# Patient Record
Sex: Female | Born: 1945 | Race: White | Hispanic: No | Marital: Married | Smoking: Never smoker
Health system: Southern US, Community
[De-identification: ages and names within clinical notes are randomized; demographics above are authoritative.]

## PROBLEM LIST (undated history)

## (undated) DIAGNOSIS — IMO0002 Reserved for concepts with insufficient information to code with codable children: Secondary | ICD-10-CM

## (undated) DIAGNOSIS — G249 Dystonia, unspecified: Secondary | ICD-10-CM

## (undated) DIAGNOSIS — R51 Headache: Secondary | ICD-10-CM

## (undated) DIAGNOSIS — IMO0001 Reserved for inherently not codable concepts without codable children: Secondary | ICD-10-CM

## (undated) DIAGNOSIS — C801 Malignant (primary) neoplasm, unspecified: Secondary | ICD-10-CM

## (undated) DIAGNOSIS — G20 Parkinson's disease: Secondary | ICD-10-CM

## (undated) DIAGNOSIS — J449 Chronic obstructive pulmonary disease, unspecified: Secondary | ICD-10-CM

## (undated) DIAGNOSIS — I951 Orthostatic hypotension: Secondary | ICD-10-CM

## (undated) DIAGNOSIS — K219 Gastro-esophageal reflux disease without esophagitis: Secondary | ICD-10-CM

## (undated) DIAGNOSIS — G20A1 Parkinson's disease without dyskinesia, without mention of fluctuations: Secondary | ICD-10-CM

## (undated) HISTORY — DX: Dystonia, unspecified: G24.9

## (undated) HISTORY — DX: Reserved for inherently not codable concepts without codable children: IMO0001

## (undated) HISTORY — DX: Gastro-esophageal reflux disease without esophagitis: K21.9

## (undated) HISTORY — DX: Parkinson's disease without dyskinesia, without mention of fluctuations: G20.A1

## (undated) HISTORY — DX: Malignant (primary) neoplasm, unspecified: C80.1

## (undated) HISTORY — DX: Reserved for concepts with insufficient information to code with codable children: IMO0002

## (undated) HISTORY — DX: Orthostatic hypotension: I95.1

## (undated) HISTORY — DX: Headache: R51

## (undated) HISTORY — DX: Chronic obstructive pulmonary disease, unspecified: J44.9

## (undated) HISTORY — DX: Parkinson's disease: G20

## (undated) HISTORY — PX: PARTIAL HIP ARTHROPLASTY: SHX733

---

## 2003-05-19 ENCOUNTER — Encounter: Payer: Self-pay | Admitting: Neurology

## 2003-05-19 ENCOUNTER — Ambulatory Visit (HOSPITAL_COMMUNITY): Admission: RE | Admit: 2003-05-19 | Discharge: 2003-05-19 | Payer: Self-pay | Admitting: Neurology

## 2003-05-22 ENCOUNTER — Ambulatory Visit (HOSPITAL_COMMUNITY): Admission: RE | Admit: 2003-05-22 | Discharge: 2003-05-22 | Payer: Self-pay | Admitting: Neurology

## 2004-07-25 ENCOUNTER — Ambulatory Visit (HOSPITAL_COMMUNITY): Admission: RE | Admit: 2004-07-25 | Discharge: 2004-07-25 | Payer: Self-pay | Admitting: Neurology

## 2004-08-04 ENCOUNTER — Encounter: Admission: RE | Admit: 2004-08-04 | Discharge: 2004-08-04 | Payer: Self-pay | Admitting: Neurology

## 2004-08-19 ENCOUNTER — Encounter: Admission: RE | Admit: 2004-08-19 | Discharge: 2004-08-19 | Payer: Self-pay | Admitting: Neurology

## 2007-03-15 ENCOUNTER — Encounter: Admission: RE | Admit: 2007-03-15 | Discharge: 2007-03-15 | Payer: Self-pay | Admitting: Neurology

## 2007-12-25 ENCOUNTER — Encounter: Payer: Self-pay | Admitting: Internal Medicine

## 2008-01-15 ENCOUNTER — Ambulatory Visit: Payer: Self-pay | Admitting: Internal Medicine

## 2008-01-15 DIAGNOSIS — G20 Parkinson's disease: Secondary | ICD-10-CM

## 2008-01-15 DIAGNOSIS — R0609 Other forms of dyspnea: Secondary | ICD-10-CM

## 2008-01-15 DIAGNOSIS — R0989 Other specified symptoms and signs involving the circulatory and respiratory systems: Secondary | ICD-10-CM

## 2008-03-06 ENCOUNTER — Ambulatory Visit: Payer: Self-pay | Admitting: Internal Medicine

## 2012-11-20 ENCOUNTER — Other Ambulatory Visit: Payer: Self-pay | Admitting: Neurology

## 2012-11-20 DIAGNOSIS — R51 Headache: Secondary | ICD-10-CM

## 2012-11-22 ENCOUNTER — Ambulatory Visit
Admission: RE | Admit: 2012-11-22 | Discharge: 2012-11-22 | Disposition: A | Discharge status: Home / Self Care | Payer: Medicare Other | Source: Ambulatory Visit | Attending: Neurology | Admitting: Neurology

## 2012-11-22 DIAGNOSIS — R51 Headache: Secondary | ICD-10-CM

## 2013-03-21 ENCOUNTER — Ambulatory Visit (INDEPENDENT_AMBULATORY_CARE_PROVIDER_SITE_OTHER): Payer: Medicare Other | Admitting: Neurology

## 2013-03-21 ENCOUNTER — Encounter: Payer: Self-pay | Admitting: Neurology

## 2013-03-21 VITALS — BP 88/59 | HR 79 | Temp 97.9°F | Ht 65.0 in | Wt 143.0 lb

## 2013-03-21 DIAGNOSIS — G20 Parkinson's disease: Secondary | ICD-10-CM

## 2013-03-21 DIAGNOSIS — R443 Hallucinations, unspecified: Secondary | ICD-10-CM

## 2013-03-21 DIAGNOSIS — I951 Orthostatic hypotension: Secondary | ICD-10-CM

## 2013-03-21 DIAGNOSIS — R279 Unspecified lack of coordination: Secondary | ICD-10-CM

## 2013-03-21 MED ORDER — AMANTADINE HCL 100 MG PO TABS: 50.0000 mg | ORAL_TABLET | Freq: Two times a day (BID) | ORAL | Status: DC

## 2013-03-21 NOTE — Patient Instructions (Addendum)
  I do have some generic suggestions for you today:    Please drink more water and try to exercise your legs.   Please keep a regular sleep-wake schedule, keep regular meal times, do not skip any meals, eat  healthy snacks in between meals, such as fruit or nuts. Try to eat protein with every meal.   Engage in social activities in your community and with your family and try to keep up with current events by reading the newspaper or watching the news.  I do not think we need to make any changes in your medications at this point. I would like to see you back in 3 months, sooner if we need to. Please call us if you have any interim questions, concerns, or problems or updates to need to discuss.  Brett Canales is my clinical assistant and will answer any of your questions and relay your messages to me and will give you my messages.   Our phone number is [phone]. We also have an after hours call service for urgent matters and there is a physician on-call for urgent questions. For any emergencies you know to call 911 or go to the nearest emergency room.

## 2013-03-21 NOTE — Progress Notes (Signed)
Subjective:    Patient ID: Jenny West is a 67 y.o. female.  HPI  Interim history:  Jenny West is a very pleasant 67 year old right-handed woman who presents for followup consultation of her Parkinson's disease. She is accompanied by her husband today. This is her first visit with me in she previously followed with Dr. Fayrene Fearing love and was last seen by him on 12/20/2012, at which time he felt that she was overall doing fairly well but still had significant postural hypotension. She had improved dyskinesias and no significant change in her MMSE on amantadine. Her falls assessment tool score was 14 at the time. Dr. Sandria Manly talked to her about trying a compression hose and increased her midodrine to 5 mg 3 times a day and continued her on Florinef 0.1 mg daily. He requested a BMP at the time, which was unremarkable. In February after a phone conversation Dr. Sandria Manly increased her amantadine to 50 mg twice daily from once daily due to increase in dyskinesias.  She has an underlying medical history of COPD, recurrent falls with fracture, lower back pain with lumbar radiculopathy, cancer. Her Parkinson's disease is complicated by gait disturbance, orthostatic hypotension, hallucinations and motor complications. Her current medications are: Amantadine 50 mg twice daily, Florinef 0.1 mg once daily, Zyrtec once daily, Sinemet 25-250 mg strength half a tablet 7 times a day, at 7:30 AM, 9:30 AM, 11:30 AM, 1:30 PM, 3:30 PM, 5 PM and 7 PM. Midodrine 5 mg 3 times a day, omeprazole 20 mg 2 by mouth daily, calcium, multivitamin.  I reviewed Dr. Imagene Gurney prior notes and the patient's records and below is a summary of that review:  67 year old right-handed woman who was diagnosed with Parkinson's disease in 1991 at which time she had difficulty using her left hand. She was placed on Eldepryl and Sinemet. She was first seen by Dr. Sandria Manly in May 1994. She had side effects from medication at the time and had side effects on  Permax before then. In December 1995 Parlodel was added. She was on this for several years but developed shortness of breath in 2004 and was tapered off of that medication. She was diagnosed with COPD and in 2008 developed postural hypotension and was started on midodrine after which Florinef was added. She has had motor fluctuations and freezing, postural hypotension and dyskinesias as well as late afternoon hallucinations. She has fallen repeatedly. She does need help with ADLs. She used a walker until December 2013. She fell in September and December 2013 and fractured her left knee. She does not have any impulse control disorder. She has urinary incontinence.She has had double vision treated with prisms. In December 2013 her MMSE was 28, clock drawing was 4, and normal fluency was 8. In January 2014 her MMSE was 27, clock drawing was 4/4, and animal fluency was 13. CT C-spine in January showed no fracture and mild to moderate degenerative changes.  She has an aide come in daily M-F from 8-2 and helps her with the meds and dressing. She has not taken her florinef this morning. She stays in the recliner most of the time and has a 2 wheeled walker which she uses at a house. She fell in the kitchen onto a wooden chair, did not hit the ground and not hit her head. She was in PT in December and January after a fall and tries to do their recommended exercises. She does not drink enough water. She does not sleep well.   Her Past Medical  History Is Significant For: Past Medical History  Diagnosis Date  . Parkinson's disease   . Dyskinesia   . Orthostatic hypotension   . Reflux   . Headache   . Cancer   . COPD (chronic obstructive pulmonary disease)   . Knee fracture     left    Her Past Surgical History Is Significant For: Past Surgical History  Procedure Laterality Date  . Partial hip arthroplasty Left     Her Family History Is Significant For: Family History  Problem Relation Age of Onset  .  Pneumonia Mother   . Heart disease Father   . Cancer Brother   . Cancer Paternal Uncle     Her Social History Is Significant For: History   Social History  . Marital Status: Married    Spouse Name: N/A    Number of Children: N/A  . Years of Education: N/A   Social History Main Topics  . Smoking status: Never Smoker   . Smokeless tobacco: None  . Alcohol Use: No  . Drug Use: No  . Sexually Active: None   Other Topics Concern  . None   Social History Narrative  . None    Her Allergies Are:  Allergies  Allergen Reactions  . Codeine   . Iodine   . Latex   :   Her Current Medications Are:  Outpatient Encounter Prescriptions as of 03/21/2013  Medication Sig Dispense Refill  . Amantadine HCl 100 MG tablet       . aspirin 81 MG tablet Take 81 mg by mouth daily.      . calcium-vitamin D 250-100 MG-UNIT per tablet Take 1 tablet by mouth daily.      . carbidopa-levodopa (SINEMET IR) 25-250 MG per tablet       . cetirizine (ZYRTEC) 10 MG tablet Take 10 mg by mouth daily.      . fish oil-omega-3 fatty acids 1000 MG capsule Take 2 g by mouth daily.      . fludrocortisone (FLORINEF) 0.1 MG tablet       . midodrine (PROAMATINE) 5 MG tablet       . Multiple Vitamins-Minerals (MULTIVITAMIN PO) Take 1 tablet by mouth daily.      Marland Kitchen omeprazole (PRILOSEC) 20 MG capsule Take 20 mg by mouth daily.      . simvastatin (ZOCOR) 20 MG tablet       . tobramycin-dexamethasone (TOBRADEX) ophthalmic solution        No facility-administered encounter medications on file as of 03/21/2013.  :  Review of Systems  Constitutional: Positive for appetite change and unexpected weight change (gain).  HENT: Positive for rhinorrhea and trouble swallowing.   Eyes: Positive for visual disturbance (double vision).  Musculoskeletal: Positive for arthralgias.  Allergic/Immunologic: Positive for environmental allergies.  Neurological: Positive for dizziness, tremors and syncope.       Memory loss   Hematological: Bruises/bleeds easily.       Anemia  Psychiatric/Behavioral: Positive for hallucinations, confusion and sleep disturbance.       Insomnia, sleepiness    Objective:  Neurologic Exam  Physical Exam Physical Examination:   Filed Vitals:   03/21/13 1049  BP: 88/59  Pulse: 79  Temp: 97.9 F (36.6 C)    General Examination: The patient is a very pleasant 67 y.o. female in no acute distress.  HEENT: Normocephalic, atraumatic, pupils are equal, round and reactive to light and accommodation. Funduscopic exam is normal with sharp disc margins noted. Extraocular tracking shows  mild saccadic breakdown without nystagmus noted. There is limitation to upper gaze. There is moderate decrease in eye blink rate. Hearing is intact. Tympanic membranes are clear bilaterally. Face is symmetric with moderate facial masking and normal facial sensation. There is no lip, neck or jaw tremor. Neck is moderately rigid with intact passive ROM. There are no carotid bruits on auscultation. Oropharynx exam reveals mild mouth dryness. No significant airway crowding is noted. Mallampati is class II. Tongue protrudes centrally and palate elevates symmetrically.   There is mild drooling.   Chest: is clear to auscultation without wheezing, rhonchi or crackles noted.  Heart: sounds are regular and normal without murmurs, rubs or gallops noted.   Abdomen: is soft, non-tender and non-distended with normal bowel sounds appreciated on auscultation.  Extremities: There is no pitting edema in the distal lower extremities bilaterally. Pedal pulses are intact.  Skin: is warm and dry with no trophic changes noted.  Musculoskeletal: exam reveals no obvious joint deformities, tenderness or joint swelling or erythema.  Neurologically:  Mental status: The patient is awake and alert, paying good  attention. She is able to partially provide the history. Her husband provides details. She is oriented to: person,  place, time/date, situation, day of week, month of year and year. Her memory, attention, language and knowledge are impaired mildly. There is no aphasia, agnosia, apraxia or anomia. There is a mild degree of bradyphrenia. Speech is moderately hypophonic with mild dysarthria noted. Mood is congruent and affect is normal.   Cranial nerves are as described above under HEENT exam. In addition, shoulder shrug is normal with equal shoulder height noted.  Motor exam: Normal bulk, and strength for age is noted. There are mild dyskinesias noted in her LEs intermittently. Tone is moderately rigid with absence of cogwheeling in the limbs. There is overall severe bradykinesia. There is no drift or rebound. There is no tremor. Romberg was not tested. Reflexes are 1+ in the upper extremities and 1+ in the lower extremities. Toes are downgoing bilaterally. Fine motor skills exam reveals: Finger taps are severely impaired on the right and severely impaired on the left. Hand movements are moderately impaired on the right and moderately impaired on the left. RAP (rapid alternating patting) is moderately impaired on the right and moderately impaired on the left. Foot taps are moderately impaired on the right and moderately impaired on the left. Foot agility (in the form of heel stomping) is moderately impaired on the right and moderately impaired on the left.    Cerebellar testing shows no dysmetria or intention tremor on finger to nose testing. Heel to shin is unremarkable bilaterally. There is no truncal or gait ataxia.   Sensory exam is intact to light touch, pinprick, vibration, temperature sense and proprioception in the upper and lower extremities.   Gait, station and balance exan: She was not asked to stand as she did not bring a walker and is at fall risk. She situated in her wheelchair in leads to the left and her neck is tilted to the left.    Assessment and Plan:   Assessment and Plan:  In summary, Jenny West is a very pleasant 67 y.o.-year old female with a history of advanced Parkinson's disease, left-sided predominant, complicated by orthostatic hypotension, hallucinations, dyskinesias, and mild memory loss. She has remained quite stable in the past 3 months when I compare my exam with Dr. Imagene Gurney last note.  I had a long chat with the patient and her husband about my  findings and the diagnosis of advanced PD, the prognosis and treatment options. We talked about medical treatments and non-pharmacological approaches. We talked about trying to maintain a healthy lifestyle in general. I encouraged the patient to eat healthy, exercise within her limitations and to keep a scheduled bedtime and wake time routine, to not skip any meals and eat healthy snacks in between meals and to have protein with every meal. I asked her to drink more water. I think it is critical that she uses leg exercises and increase her water intake. I did not make any changes to her medications but asked her to be regular with her medication schedule. She did not take her Florinef this morning yet. They needed refills on the amantadine which I provided. I would like to see her back in 3 months from now, sooner if the need arises and encouraged them to call with any interim questions, concerns, problems or updates and refill requests.

## 2013-06-04 ENCOUNTER — Other Ambulatory Visit: Payer: Self-pay

## 2013-06-04 DIAGNOSIS — G20 Parkinson's disease: Secondary | ICD-10-CM

## 2013-06-04 MED ORDER — CARBIDOPA-LEVODOPA 25-250 MG PO TABS: ORAL_TABLET | ORAL | Status: DC

## 2013-06-04 NOTE — Telephone Encounter (Signed)
Spouse called requesting refills on Sinemet.

## 2013-08-04 IMAGING — CT CT HEAD W/O CM
3 series · 18 of 30 positions shown, 20 images · non-contrast
Comparison: none

[Series 3: head bone · axial · 0.49mm/px · z∈[+40,+134]mm · 5 of 32 slices shown]
[im 5/32  bone]
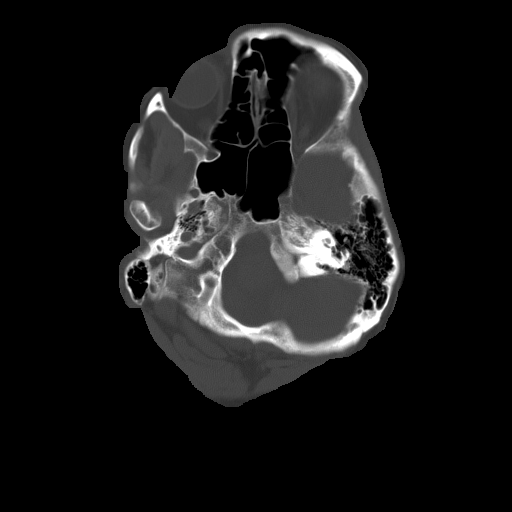
[im 9/32  bone]
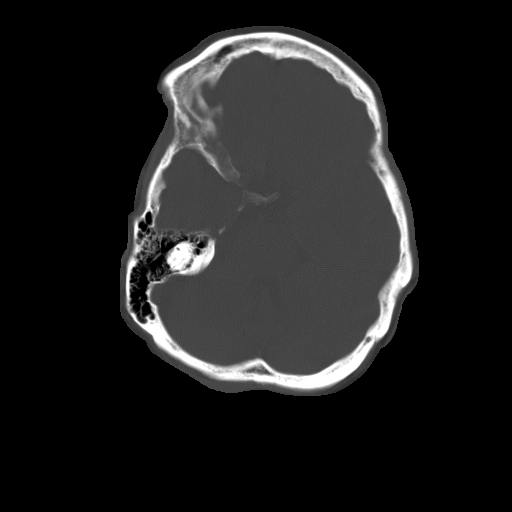
[im 14/32  bone]
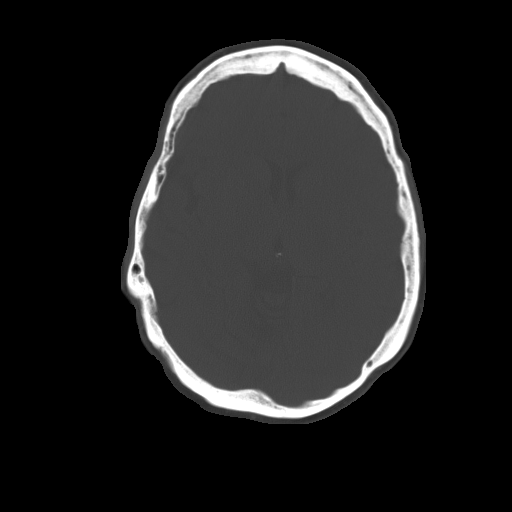
[im 18/32  bone]
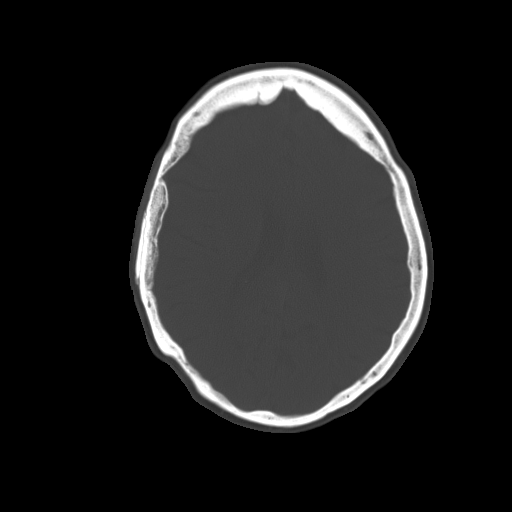
[im 23/32  bone]
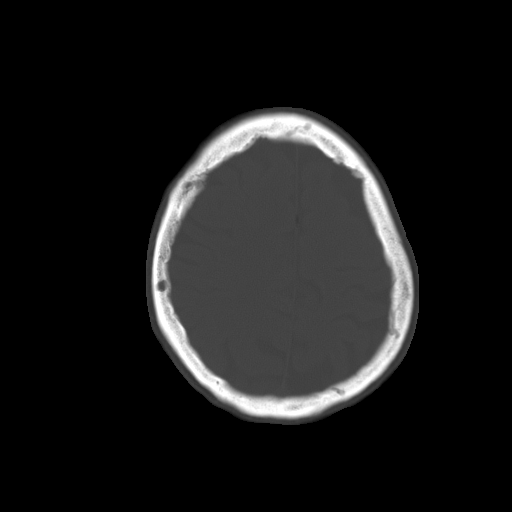

[Series 32: 3d filtered head w/o · axial · non-contrast · 0.49mm/px · z∈[+45,+150]mm · 5 of 32 slices shown, 7 images]
[im 6/32  brain]
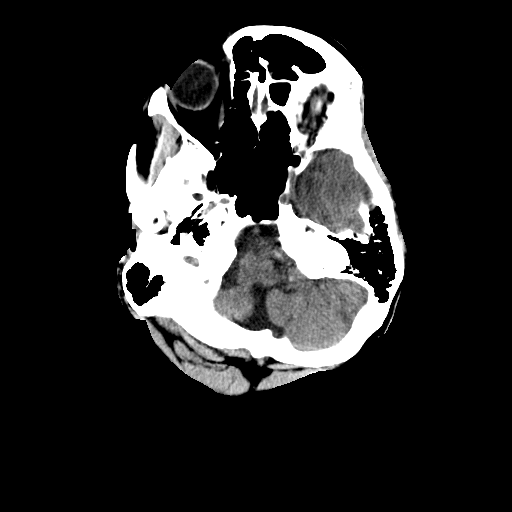
[im 6/32  bone]
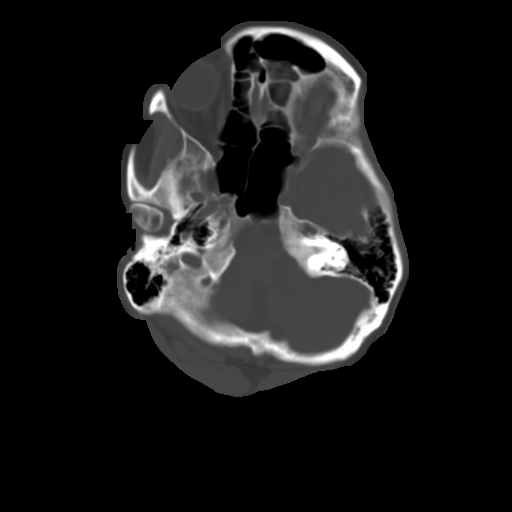
[im 11/32  brain]
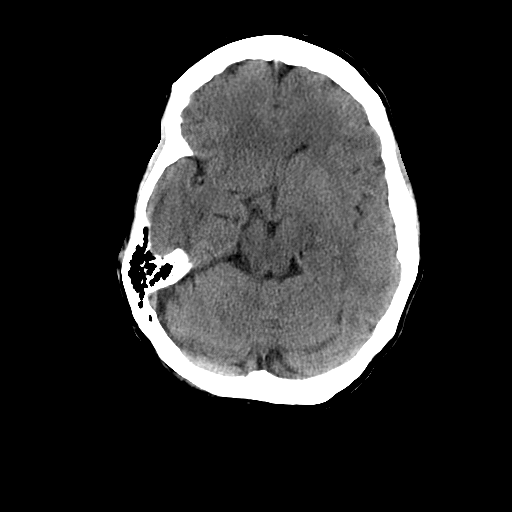
[im 16/32  brain]
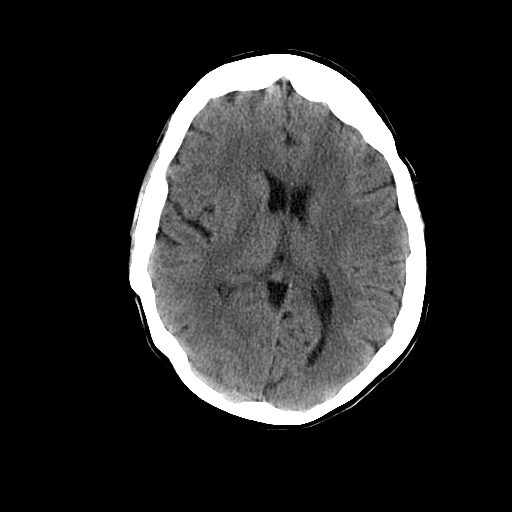
[im 21/32  brain]
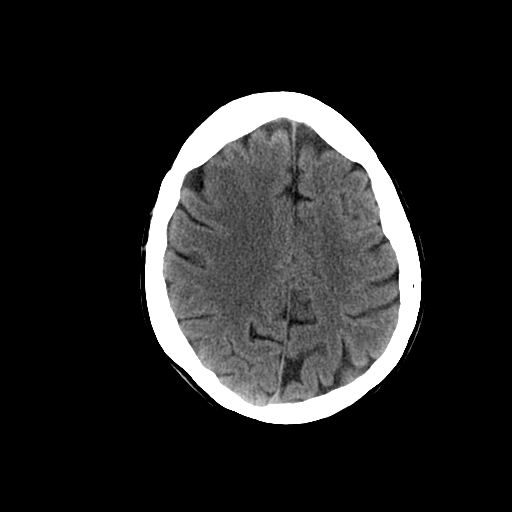
[im 26/32  brain]
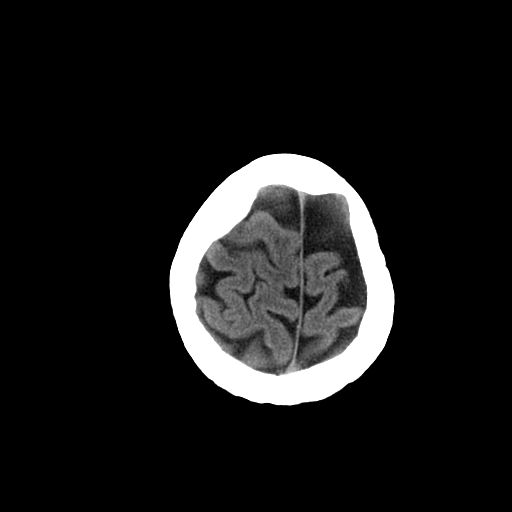
[im 26/32  bone]
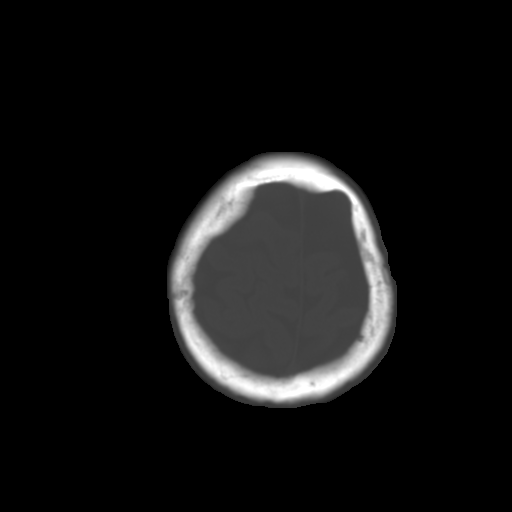

[Series 103: reformatted axial · axial · 0.49mm/px · z∈[+106,+214]mm · 8 of 65 slices shown]
[im 5/65  brain]
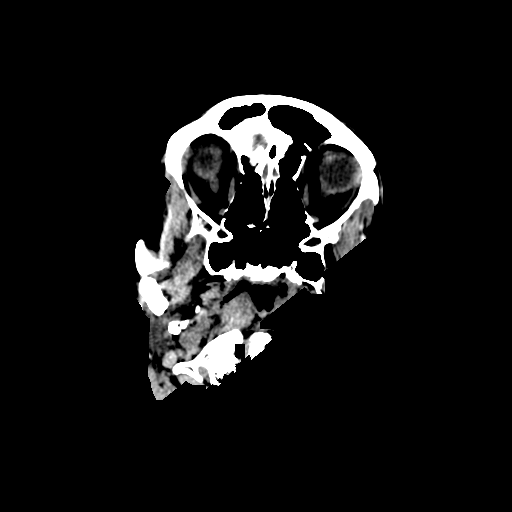
[im 14/65  brain]
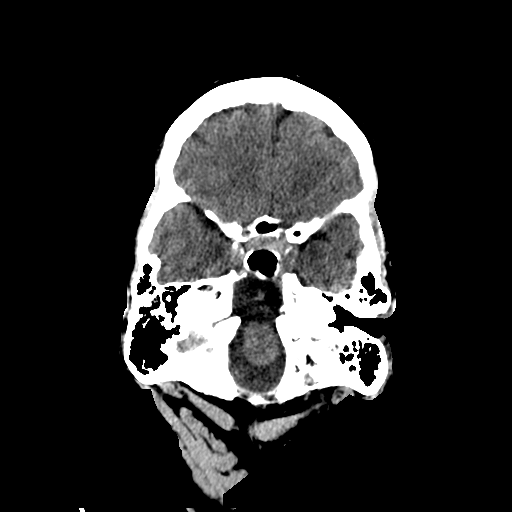
[im 23/65  brain]
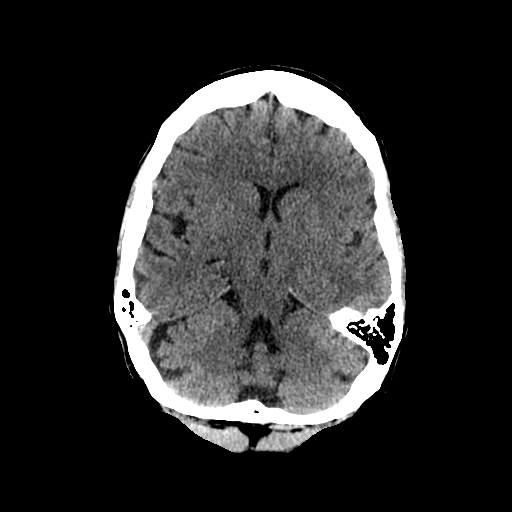
[im 28/65  brain]
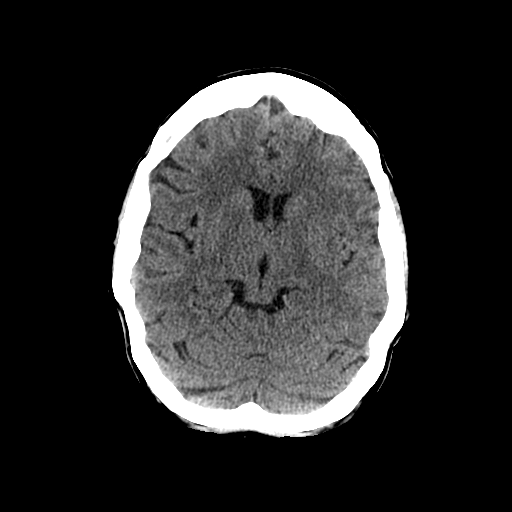
[im 37/65  brain]
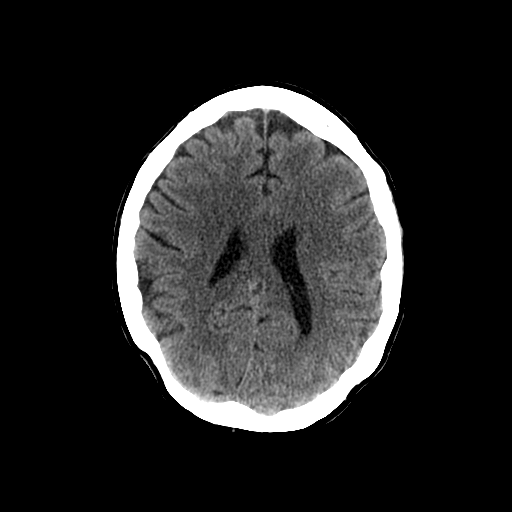
[im 42/65  brain]
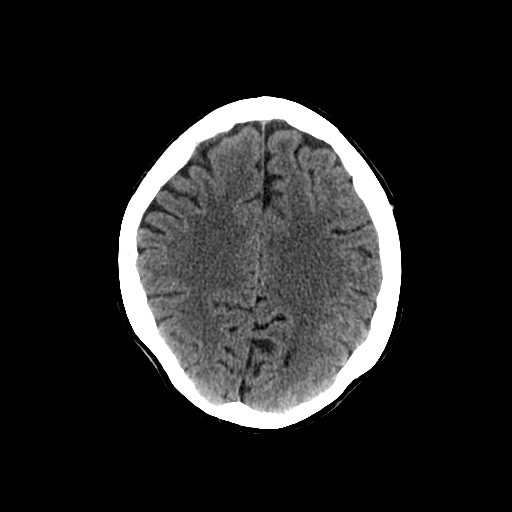
[im 51/65  brain]
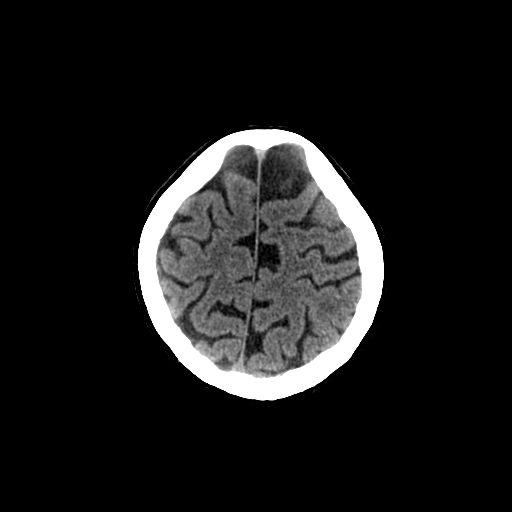
[im 60/65  brain]
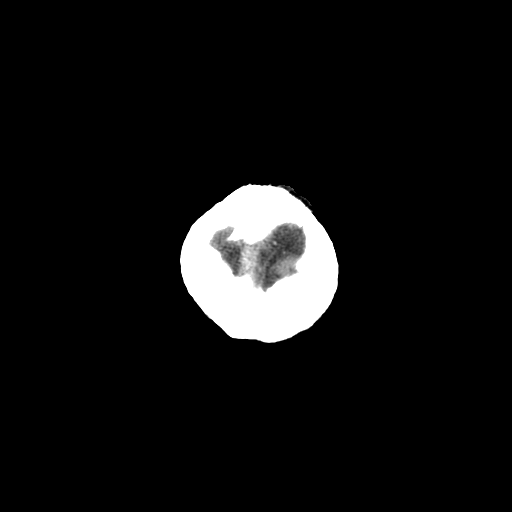

[18 of 30 positions shown; findings below may reference images not displayed]

This examination was performed at [HOSPITAL] at [HOSPITAL]
[HOSPITAL]. The interpretation will be provided by [REDACTED].

## 2013-08-04 IMAGING — CT CT CERVICAL SPINE W/O CM
2 of 3 series · 7 of 14 positions shown, 8 images · non-contrast
Comparison: None.

CLINICAL DATA: Posterior neck pain x1 year

CT CERVICAL SPINE WITHOUT CONTRAST
TECHNIQUE: Multidetector CT imaging of the cervical spine was
performed. Multiplanar CT image reconstructions were also
generated.

[Series 3: c spine bone · axial · 0.31mm/px · z∈[+81,+173]mm · 3 of 75 slices shown, 4 images]
[im 19/75  soft-tissue]
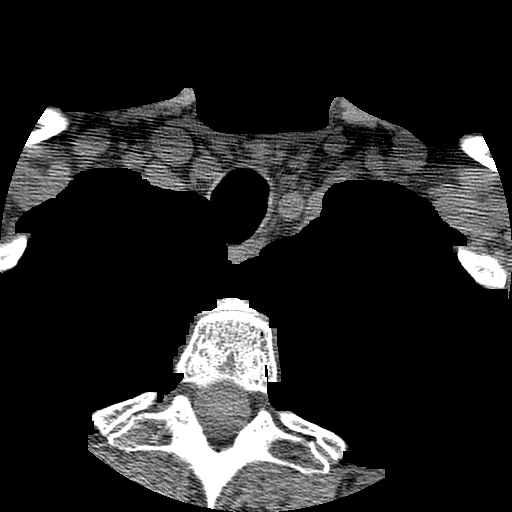
[im 19/75  bone]
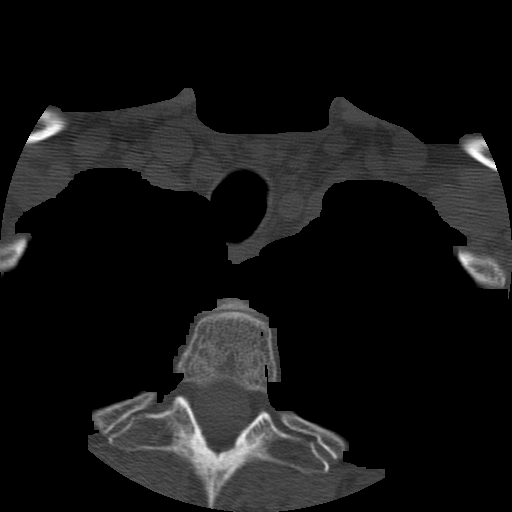
[im 38/75  bone]
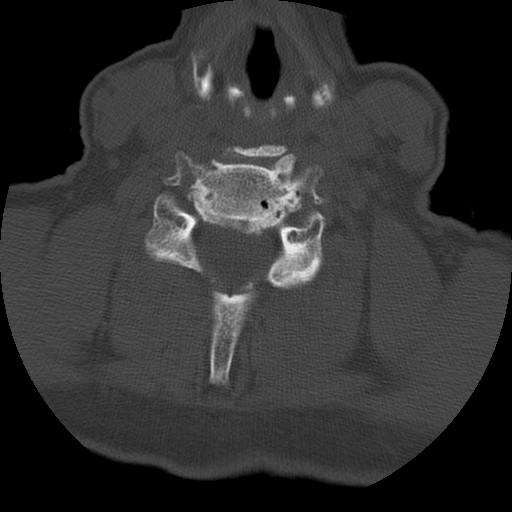
[im 56/75  bone]
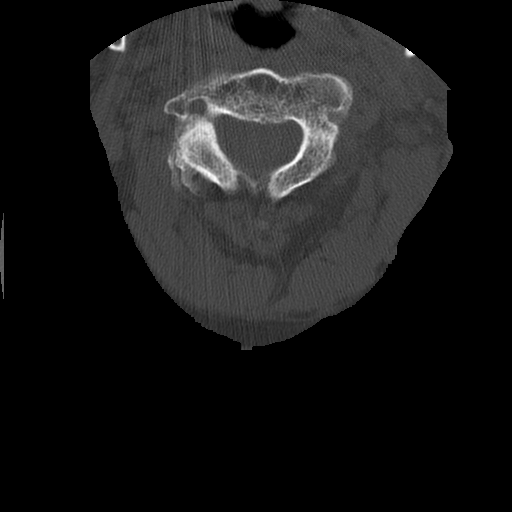

[Series 4: c spine soft · axial · 0.31mm/px · z∈[+71,+183]mm · 4 of 75 slices shown]
[im 15/75  soft-tissue]
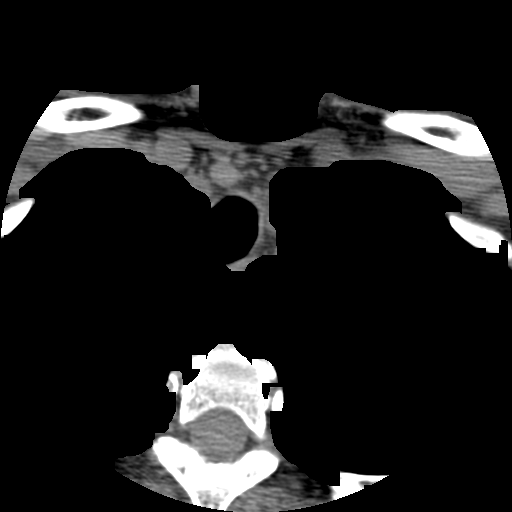
[im 30/75  soft-tissue]
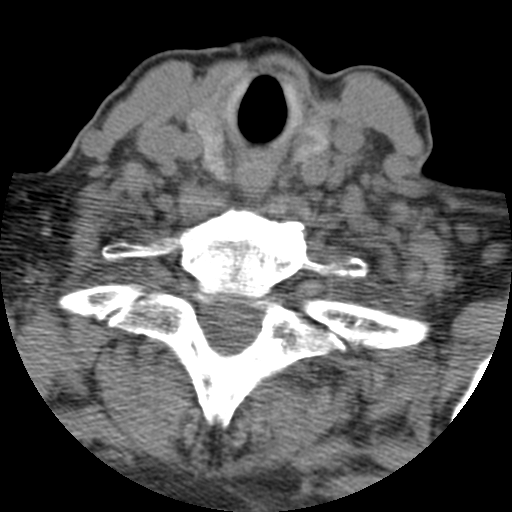
[im 45/75  soft-tissue]
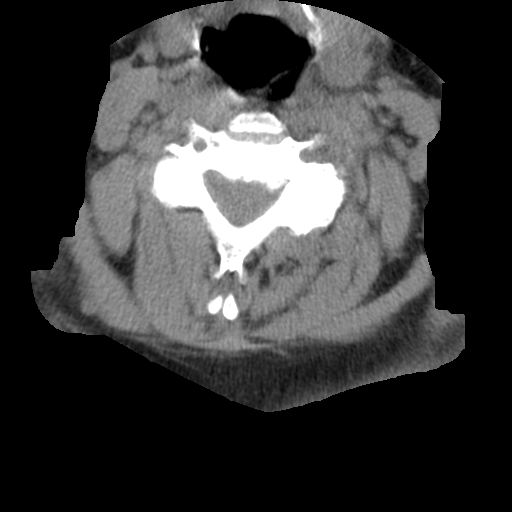
[im 60/75  soft-tissue]
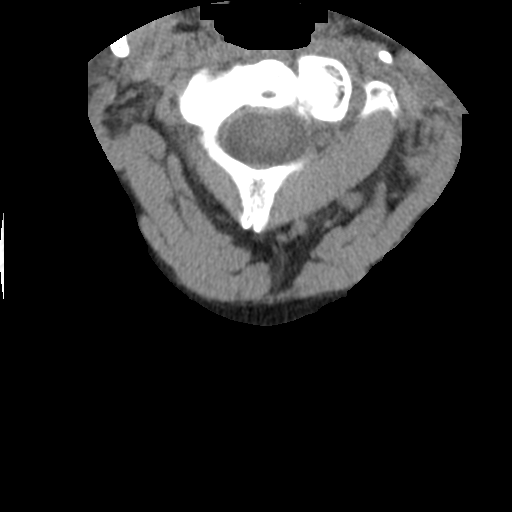

[7 of 14 positions shown; findings below may reference images not displayed]

FINDINGS: Normal cervical lordosis.

No evidence of fracture or dislocation.  Vertebral body heights are
maintained.  The dens appears intact.

No prevertebral soft tissue swelling.

Moderate degenerative changes at C4-5 and C5-6.  Mild degenerative
changes at C6-7. Moderate narrowing of the left C3-4 and C4-5 nerve
roots.

Visualized left thyroid is mildly nodular (series 4/image 47).

Visualized lung apices are notable for biapical pleural parenchymal
scarring.
IMPRESSION: No fracture or dislocation is seen.

Mild to moderate degenerative changes, most prominent at C4-5 and
C5-6.

Moderate narrowing of the left C3-4 and C4-5 nerve roots.

## 2013-09-24 ENCOUNTER — Other Ambulatory Visit: Payer: Self-pay | Admitting: Neurology

## 2013-09-26 ENCOUNTER — Ambulatory Visit (INDEPENDENT_AMBULATORY_CARE_PROVIDER_SITE_OTHER): Payer: Medicare Other | Admitting: Neurology

## 2013-09-26 ENCOUNTER — Encounter: Payer: Self-pay | Admitting: Neurology

## 2013-09-26 VITALS — BP 107/66 | HR 83 | Temp 96.9°F | Ht 65.0 in

## 2013-09-26 DIAGNOSIS — R279 Unspecified lack of coordination: Secondary | ICD-10-CM

## 2013-09-26 DIAGNOSIS — R296 Repeated falls: Secondary | ICD-10-CM

## 2013-09-26 DIAGNOSIS — G20A1 Parkinson's disease without dyskinesia, without mention of fluctuations: Secondary | ICD-10-CM

## 2013-09-26 DIAGNOSIS — I951 Orthostatic hypotension: Secondary | ICD-10-CM

## 2013-09-26 DIAGNOSIS — R413 Other amnesia: Secondary | ICD-10-CM

## 2013-09-26 DIAGNOSIS — Z9181 History of falling: Secondary | ICD-10-CM

## 2013-09-26 DIAGNOSIS — G20 Parkinson's disease: Secondary | ICD-10-CM

## 2013-09-26 DIAGNOSIS — R443 Hallucinations, unspecified: Secondary | ICD-10-CM

## 2013-09-26 MED ORDER — FLUDROCORTISONE ACETATE 0.1 MG PO TABS: 0.1000 mg | ORAL_TABLET | Freq: Every day | ORAL | Status: DC

## 2013-09-26 MED ORDER — CARBIDOPA-LEVODOPA 25-250 MG PO TABS: ORAL_TABLET | ORAL | Status: DC

## 2013-09-26 MED ORDER — MIDODRINE HCL 5 MG PO TABS: 5.0000 mg | ORAL_TABLET | Freq: Two times a day (BID) | ORAL | Status: DC

## 2013-09-26 NOTE — Patient Instructions (Signed)
I think your Parkinson's disease has remained fairly stable, which is reassuring. Nevertheless, as you know, this disease does progress with time. It can affect your balance, your memory, your mood, your bowel and bladder function, your posture, balance and walking. Overall you are doing fairly well but I do want to suggest a few things today:  Remember to drink plenty of fluid, eat healthy meals and do not skip any meals. Try to eat protein with a every meal and eat a healthy snack such as fruit or nuts in between meals. Try to keep a regular sleep-wake schedule and try to exercise daily. When you stand or transfer, use your walker with assistance.   Taking your medication on schedule is key.   Try to stay active physically and mentally. Engage in social activities in your community and with your family and try to keep up with current events by reading the newspaper or watching the news. Try to do word puzzles and you may like to do word puzzles and brain games on the computer such as on [email or web address]   As far as your medications are concerned, I would like to suggest that you take your current medication with the following additional changes: no new change.    As far as diagnostic testing, I will order: no new test.   I would like to see you back in 6 months, sooner if we need to. Please call us with any interim questions, concerns, problems, updates or refill requests.  Brett Canales is my clinical assistant and will answer any of your questions and relay your messages to me and also relay most of my messages to you.  Our phone number is [phone]. We also have an after hours call service for urgent matters and there is a physician on-call for urgent questions, that cannot wait till the next work day. For any emergencies you know to call 911 or go to the nearest emergency room.

## 2013-09-26 NOTE — Progress Notes (Signed)
Subjective:    Patient ID: Jenny West is a 67 y.o. female.  HPI  Interim history:   Ms. Dohn is a very pleasant 67 year old right-handed woman who presents for followup consultation of her advanced Parkinson's disease, complicated by gait disturbance with recurrent falls, orthostatic hypotension, hallucinations and motor complications. She is accompanied by her husband again today. I first met her on 03/21/13, at which time I felt she was clinically stable and did not make any medication changes but did re-iterate to them the importance of taking the medication on time.  She previously followed with Dr. Fayrene Fearing love and was last seen by him on 12/20/2012, at which time he felt that she was overall doing fairly well but still had significant postural hypotension. She had improved dyskinesias and no significant change in her MMSE on amantadine. Her falls assessment tool score was 14 at the time. Dr. Sandria Manly talked to her about trying a compression hose and increased her midodrine to 5 mg 3 times a day and continued her on Florinef 0.1 mg daily. He requested a BMP at the time, which was unremarkable. In February after a phone conversation Dr. Sandria Manly increased her amantadine to 50 mg twice daily from once daily due to increase in dyskinesias.  She has an underlying medical history of recurrent falls with fracture, lower back pain with lumbar radiculopathy, cancer and Parkinson's disease.   Her current medications are: Amantadine 50 mg twice daily, Florinef 0.1 mg once daily, Zyrtec once daily, Sinemet 25-250 mg strength half a tablet 7 times a day, at 7:30 AM, 9:30 AM, 11:30 AM, 1:30 PM, 3:30 PM, 5 PM and 7 PM. Midodrine 5 mg 2 times a day, omeprazole 20 mg 2 by mouth daily, calcium, multivitamin.  She was diagnosed with Parkinson's disease in 1991 at which time she had difficulty using her left hand. She was placed on Eldepryl and Sinemet. She was first seen by Dr. Sandria Manly in May 1994. She had side effects  from medication at the time and had side effects on Permax before then. In December 1995 Parlodel was added. She was on this for several years but developed shortness of breath in 2004 and was tapered off of that medication. She was diagnosed with COPD and in 2008 developed postural hypotension and was started on midodrine after which Florinef was added. She has had motor fluctuations and freezing, postural hypotension and dyskinesias as well as late afternoon hallucinations. She has fallen repeatedly. She does need help with ADLs. She used a walker until December 2013. She fell in September and December 2013 and fractured her left knee. She does not have any history of impulse control disorder. She has urinary incontinence. She has had double vision treated with prisms. In December 2013 her MMSE was 28, clock drawing was 4, and AFT was 8. In January 2014 her MMSE was 27, clock drawing was 4/4, and animal fluency was 13. CT C-spine in January showed no fracture and mild to moderate degenerative changes.  She has an aide come in daily M-F from 8-2, who helps her with the meds and dressing. He still works. She stays in the recliner most of the time and has a 2 wheeled walker which she uses at home. She fell in the kitchen onto a wooden chair, did not hit the ground and not hit her head. She was in PT in December and January after a fall and tries to do their recommended exercises. She does not drink enough water. She does  not sleep well, she cannot sleep in bed, and sleeps in the recliner. She has an occasional abnormal sensation in her head, which she describes as knocking or thumb, no pain, no other associated Sx. She quit taking the amantadine as she felt it made her dyskinesias worse, and after stopping it she felt her dyskinesias may have been a little better. She also reduced her midodrine to twice daily.  Her Past Medical History Is Significant For: Past Medical History  Diagnosis Date  . Parkinson's  disease   . Dyskinesia   . Orthostatic hypotension   . Reflux   . Headache(784.0)   . Cancer   . COPD (chronic obstructive pulmonary disease)   . Knee fracture     left    Her Past Surgical History Is Significant For: Past Surgical History  Procedure Laterality Date  . Partial hip arthroplasty Left     Her Family History Is Significant For: Family History  Problem Relation Age of Onset  . Pneumonia Mother   . Heart disease Father   . Cancer Brother   . Cancer Paternal Uncle     Her Social History Is Significant For: History   Social History  . Marital Status: Married    Spouse Name: N/A    Number of Children: N/A  . Years of Education: N/A   Social History Main Topics  . Smoking status: Never Smoker   . Smokeless tobacco: None  . Alcohol Use: No  . Drug Use: No  . Sexual Activity: None   Other Topics Concern  . None   Social History Narrative  . None    Her Allergies Are:  Allergies  Allergen Reactions  . Morphine And Related Anaphylaxis  . Codeine   . Iodine   . Latex   :   Her Current Medications Are:  Outpatient Encounter Prescriptions as of 09/26/2013  Medication Sig  . aspirin 81 MG tablet Take 81 mg by mouth daily.  . calcium-vitamin D 250-100 MG-UNIT per tablet Take 1 tablet by mouth daily.  . carbidopa-levodopa (SINEMET IR) 25-250 MG per tablet One half tab po 7 times daily as directed.  . fish oil-omega-3 fatty acids 1000 MG capsule Take 2 g by mouth daily.  . fludrocortisone (FLORINEF) 0.1 MG tablet Take 1 tablet (0.1 mg total) by mouth daily.  Marland Kitchen loratadine (CLARITIN) 10 MG tablet Take 10 mg by mouth daily.  . midodrine (PROAMATINE) 5 MG tablet Take 1 tablet (5 mg total) by mouth 2 (two) times daily with a meal.  . Multiple Vitamins-Minerals (MULTIVITAMIN PO) Take 1 tablet by mouth daily.  Marland Kitchen omeprazole (PRILOSEC) 20 MG capsule Take 20 mg by mouth daily.  Bertram Gala Glycol-Propyl Glycol (SYSTANE OP) Apply 1 drop to eye daily.  .  simvastatin (ZOCOR) 20 MG tablet   . [DISCONTINUED] carbidopa-levodopa (SINEMET IR) 25-250 MG per tablet One half tab po 7 times daily as directed.  . [DISCONTINUED] fludrocortisone (FLORINEF) 0.1 MG tablet   . [DISCONTINUED] midodrine (PROAMATINE) 5 MG tablet   . [DISCONTINUED] Amantadine HCl 100 MG tablet Take 0.5 tablets (50 mg total) by mouth 2 (two) times daily.  . [DISCONTINUED] cetirizine (ZYRTEC) 10 MG tablet Take 10 mg by mouth daily.  . [DISCONTINUED] tobramycin-dexamethasone (TOBRADEX) ophthalmic solution   :  Review of Systems:  Out of a complete 14 point review of systems, all are reviewed and negative with the exception of these symptoms as listed below:  Review of Systems  Constitutional: Positive for unexpected  weight change (gain).  HENT: Negative.   Eyes: Positive for visual disturbance (diplopia).  Respiratory: Negative.   Cardiovascular: Negative.   Gastrointestinal: Positive for constipation.  Endocrine:       Flushing  Genitourinary:       Incontinence  Musculoskeletal: Negative.   Skin: Negative.   Allergic/Immunologic: Positive for environmental allergies.  Neurological: Positive for tremors and weakness.       Memory loss  Hematological: Negative.   Psychiatric/Behavioral: Positive for hallucinations. The patient is nervous/anxious.     Objective:  Neurologic Exam  Physical Exam Physical Examination:   Filed Vitals:   09/26/13 1115  BP: 107/66  Pulse: 83  Temp: 96.9 F (36.1 C)    General Examination: The patient is a very pleasant 67 y.o. female in no acute distress. She is situated in her wheelchair.  HEENT: Normocephalic, atraumatic, pupils are equal, round and reactive to light and accommodation. Extraocular tracking shows mild saccadic breakdown without nystagmus noted. There is limitation to upper gaze. There is moderate decrease in eye blink rate. Hearing is intact. Face is symmetric with moderate facial masking and normal facial  sensation. There is no lip, neck or jaw tremor. Neck is moderately rigid and she has evidence of cervical dystonia with left neck tilt and slight right turn. There are no carotid bruits on auscultation. Oropharynx exam reveals mild mouth dryness. No significant airway crowding is noted. Mallampati is class II. Tongue protrudes centrally and palate elevates symmetrically. There is mild drooling.   Chest: is clear to auscultation without wheezing, rhonchi or crackles noted.  Heart: sounds are regular and normal without murmurs, rubs or gallops noted.   Abdomen: is soft, non-tender and non-distended with normal bowel sounds appreciated on auscultation.  Extremities: There is no pitting edema in the distal lower extremities bilaterally. Pedal pulses are intact.  Skin: is warm and dry with no trophic changes noted.  Musculoskeletal: exam reveals no obvious joint deformities, tenderness or joint swelling or erythema.  Neurologically:  Mental status: The patient is awake and alert, paying good  attention. She is able to partially provide the history. Her husband provides details. She is oriented to: person, place, time/date, situation, day of week, month of year and year. Her memory, attention, language and knowledge are impaired mildly. There is no aphasia, agnosia, apraxia or anomia. There is a moderate degree of bradyphrenia. Speech is moderately hypophonic with mild dysarthria noted. Mood is congruent and affect is normal.   Cranial nerves are as described above under HEENT exam. In addition, shoulder shrug is normal with equal shoulder height noted.  Motor exam: Normal bulk, and strength for age is noted. There are no dyskinesias noted. She took her medication about an hour before the exam. Tone is moderately rigid with absence of cogwheeling in the limbs. There is overall severe bradykinesia. There is no drift or rebound. There is no tremor. Romberg was not tested. Reflexes are 1+ in the upper  extremities and 1+ in the lower extremities. Fine motor skills exam reveals: Finger taps are severely impaired on the right and severely impaired on the left. Hand movements are moderately impaired on the right and moderately impaired on the left. RAP (rapid alternating patting) is moderately impaired on the right and moderately impaired on the left. Foot taps are moderately impaired on the right and moderately impaired on the left. Foot agility (in the form of heel stomping) is moderately impaired on the right and moderately impaired on the left.  Cerebellar testing shows no dysmetria or intention tremor on finger to nose testing. Heel to shin is unremarkable bilaterally. There is no truncal or gait ataxia.   Sensory exam is intact to light touch.   Gait, station and balance exam: She was not asked to stand as she did not bring a walker and is at fall risk. She situated in her wheelchair in leans to the left and her neck is tilted to the left.    Assessment and Plan:    In summary, Deon Ivey is a very pleasant 67 y.o.-year old female with a history of advanced Parkinson's disease, left-sided predominant, complicated by orthostatic hypotension, hallucinations, dyskinesias, recurrent falls, cervical dystonia, and mild memory loss. She has remained fairly stable in the last 6 months.   I again had a long chat with the patient and her husband about my findings and the diagnosis of advanced PD, the prognosis and treatment options. We talked about medical treatments and non-pharmacological approaches. We talked about trying to maintain a healthy lifestyle in general. I encouraged the patient to eat healthy, exercise within her limitations and to keep a scheduled bedtime and wake time routine, to not skip any meals and eat healthy snacks in between meals and to have protein with every meal. I asked her to drink more water. I think it is critical that she uses leg exercises and increase her water  intake. I provided him with additional information material on Parkinson's disease, and exercise handouts. I do not think we need to make any changes in her medications. I did refill her medications including Florinef, Sinemet, ProAmatine. I would like to see her back in 6 months from now, sooner if the need arises and encouraged them to call with any interim questions, concerns, problems or updates and refill requests. They were in agreement.

## 2013-12-12 ENCOUNTER — Other Ambulatory Visit: Payer: Self-pay | Admitting: Neurology

## 2014-01-15 ENCOUNTER — Other Ambulatory Visit: Payer: Self-pay | Admitting: Neurology

## 2014-03-27 ENCOUNTER — Encounter (INDEPENDENT_AMBULATORY_CARE_PROVIDER_SITE_OTHER): Payer: Self-pay

## 2014-03-27 ENCOUNTER — Ambulatory Visit (INDEPENDENT_AMBULATORY_CARE_PROVIDER_SITE_OTHER): Payer: Medicare Other | Admitting: Neurology

## 2014-03-27 ENCOUNTER — Encounter: Payer: Self-pay | Admitting: Neurology

## 2014-03-27 VITALS — BP 130/71 | HR 72 | Ht 66.0 in | Wt 151.5 lb

## 2014-03-27 DIAGNOSIS — G249 Dystonia, unspecified: Secondary | ICD-10-CM

## 2014-03-27 DIAGNOSIS — R443 Hallucinations, unspecified: Secondary | ICD-10-CM

## 2014-03-27 DIAGNOSIS — R279 Unspecified lack of coordination: Secondary | ICD-10-CM

## 2014-03-27 DIAGNOSIS — R296 Repeated falls: Secondary | ICD-10-CM

## 2014-03-27 DIAGNOSIS — Z9181 History of falling: Secondary | ICD-10-CM

## 2014-03-27 DIAGNOSIS — I951 Orthostatic hypotension: Secondary | ICD-10-CM

## 2014-03-27 DIAGNOSIS — G20 Parkinson's disease: Secondary | ICD-10-CM

## 2014-03-27 DIAGNOSIS — R413 Other amnesia: Secondary | ICD-10-CM

## 2014-03-27 MED ORDER — FLUDROCORTISONE ACETATE 0.1 MG PO TABS: 0.1000 mg | ORAL_TABLET | Freq: Every day | ORAL | Status: DC

## 2014-03-27 MED ORDER — CARBIDOPA-LEVODOPA 25-250 MG PO TABS: ORAL_TABLET | ORAL | Status: DC

## 2014-03-27 MED ORDER — MIDODRINE HCL 5 MG PO TABS: 5.0000 mg | ORAL_TABLET | Freq: Two times a day (BID) | ORAL | Status: DC

## 2014-03-27 NOTE — Patient Instructions (Signed)
We will continue the same medications. We will request an opinion from [facility] neurology, Dr. Tonye Royalty or Dr. Linus Mako.

## 2014-03-27 NOTE — Progress Notes (Signed)
Subjective:    Patient ID: Jenny West is a 68 y.o. female.  HPI   Interim history:   Jenny West is a very pleasant 68 year old right-handed woman with an underlying medical history of recurrent falls with fracture, lower back pain with lumbar radiculopathy, COPD, diplopia, and cancer, who presents for followup consultation of her advanced left-sided predominant Parkinson's disease, complicated by orthostatic hypotension, hallucinations, dyskinesias, sleep disturbance, recurrent falls, cervical dystonia and mild memory loss. She is accompanied by her husband again today. I last saw her on 09/26/2013, at which time I asked her to drink more water and do more leg exercises. I did not make changes to her medications and asked her to continue Florinef, Sinemet, ProAmatine and I referred her to physical therapy.   Today, her husband reports, they are both exhausted as they went to see their new grandchild in [place], [place] and they did not stop overnight. She feels her hallucinations are stable. She has occasional yelling out in sleep, typically in the early morning. She sleeps in her recliner. She feels, that her medications do not work as long for her now. She stopped the amantadine d/t leg cramps. She has nocturia x 2-3, but they use a bedside commode. He helps her out from her recliner and then helps her transfer. She has minimal walking only with maximum assistance. She has dyskinesias in her lower Charlie's, particularly with end of dose and later in the day. At night she often has visual illusions. She denies significant depression or anxiety. Her memory is stable. She reports that she has a friend who recently had a DBS placed at [facility].  I first met her on 03/21/13, at which time I felt she was clinically stable and did not make any medication changes but did re-iterate to them the importance of taking the medication on time.  She previously followed with Dr. Morene Antu and was last seen by him  on 12/20/2012, at which time he felt that she was overall doing fairly well but still had significant postural hypotension. She had improved dyskinesias and no significant change in her MMSE on amantadine. Her falls assessment tool score was 14 at the time. Dr. Erling Cruz talked to her about trying a compression hose and increased her midodrine to 5 mg 3 times a day and continued her on Florinef 0.1 mg daily. He requested a BMP at the time, which was unremarkable. In February after a phone conversation Dr. Erling Cruz increased her amantadine to 50 mg twice daily from once daily due to increase in dyskinesias.   She has been on Amantadine 50 mg twice daily, Florinef 0.1 mg once daily, Zyrtec once daily, Sinemet 25-250 mg strength half a tablet 7 times a day, starting at 7:30 AM, then 9:30 AM, 11:30 AM, 1:30 PM, 3:30 PM, 5 PM and 7 PM. Midodrine 5 mg 2 times a day, omeprazole 20 mg 2 by mouth daily, calcium, multivitamin.  She was diagnosed with Parkinson's disease in 1991 at which time she had difficulty using her left hand. She was placed on Eldepryl and Sinemet. She was first seen by Dr. Erling Cruz in May 1994. She had side effects from medication at the time and had side effects on Permax before then. In December 1995 Parlodel was added. She was on this for several years but developed shortness of breath in 2004 and was tapered off of that medication. She was diagnosed with COPD and in 2008 developed postural hypotension and was started on midodrine after which Florinef  was added. She has had motor fluctuations and freezing, postural hypotension and dyskinesias as well as late afternoon hallucinations. She has fallen repeatedly. She does need help with ADLs. She used a walker until December 2013. She fell in September and December 2013 and fractured her left knee. She does not have any history of impulse control disorder. She has urinary incontinence. She has had double vision treated with prisms. In December 2013 her MMSE was  28, clock drawing was 4, and AFT was 8. In January 2014 her MMSE was 27, clock drawing was 4/4, and animal fluency was 13. CT C-spine in January showed no fracture and mild to moderate degenerative changes.   Her Past Medical History Is Significant For: Past Medical History  Diagnosis Date  . Parkinson's disease   . Dyskinesia   . Orthostatic hypotension   . Reflux   . Headache(784.0)   . Cancer   . COPD (chronic obstructive pulmonary disease)   . Knee fracture     left    Her Past Surgical History Is Significant For: Past Surgical History  Procedure Laterality Date  . Partial hip arthroplasty Left     Her Family History Is Significant For: Family History  Problem Relation Age of Onset  . Pneumonia Mother   . Heart disease Father   . Cancer Brother   . Cancer Paternal Uncle     Her Social History Is Significant For: History   Social History  . Marital Status: Married    Spouse Name: Jenny West    Number of Children: 3  . Years of Education: MA   Occupational History  .     Social History Main Topics  . Smoking status: Never Smoker   . Smokeless tobacco: Never Used  . Alcohol Use: No  . Drug Use: No  . Sexual Activity: None   Other Topics Concern  . None   Social History Narrative   Patient lives at home with family.   Caffeine Use: none    Her Allergies Are:  Allergies  Allergen Reactions  . Morphine And Related Anaphylaxis  . Codeine   . Iodine   . Latex   :   Her Current Medications Are:  Outpatient Encounter Prescriptions as of 03/27/2014  Medication Sig  . alendronate (FOSAMAX) 70 MG tablet Take 1 tablet by mouth once a week.  Marland Kitchen aspirin 81 MG tablet Take 81 mg by mouth daily.  . calcium carbonate (OS-CAL) 600 MG TABS tablet Take 600 mg by mouth 2 (two) times daily with a meal.  . carbidopa-levodopa (SINEMET IR) 25-250 MG per tablet TAKE 1/2 TABLET BY MOUTH SEVEN TIMES A DAY AS DIRECTED  . doxycycline (VIBRAMYCIN) 50 MG capsule Take 1 capsule by  mouth daily.  . fish oil-omega-3 fatty acids 1000 MG capsule Take 2 g by mouth daily.  . fludrocortisone (FLORINEF) 0.1 MG tablet Take 1 tablet (0.1 mg total) by mouth daily.  Marland Kitchen loratadine (CLARITIN) 10 MG tablet Take 10 mg by mouth daily.  . midodrine (PROAMATINE) 5 MG tablet Take 1 tablet (5 mg total) by mouth 2 (two) times daily with a meal.  . Multiple Vitamins-Minerals (MULTIVITAMIN PO) Take 1 tablet by mouth daily.  Marland Kitchen omeprazole (PRILOSEC) 20 MG capsule Take 20 mg by mouth daily.  Vladimir Faster Glycol-Propyl Glycol (SYSTANE OP) Apply 1 drop to eye daily.  . simvastatin (ZOCOR) 20 MG tablet   . ZYLET 0.5-0.3 % SUSP Apply 1 drop to eye daily.  . [DISCONTINUED] carbidopa-levodopa (SINEMET  IR) 25-250 MG per tablet TAKE 1/2 TABLET BY MOUTH SEVEN TIMES A DAY AS DIRECTED  . [DISCONTINUED] fludrocortisone (FLORINEF) 0.1 MG tablet Take 1 tablet (0.1 mg total) by mouth daily.  . [DISCONTINUED] midodrine (PROAMATINE) 5 MG tablet Take 1 tablet (5 mg total) by mouth 2 (two) times daily with a meal.  . [DISCONTINUED] calcium-vitamin D 250-100 MG-UNIT per tablet Take 1 tablet by mouth daily.  :  Review of Systems:  Out of a complete 14 point review of systems, all are reviewed and negative with the exception of these symptoms as listed below:  Review of Systems  HENT: Positive for drooling.   Eyes: Positive for photophobia, discharge, redness and itching.  Gastrointestinal: Positive for constipation.  Musculoskeletal: Positive for gait problem, myalgias (muscle cramps), neck pain and neck stiffness.  Skin:       Moles, itching  Allergic/Immunologic: Positive for environmental allergies.  Neurological: Positive for tremors, speech difficulty and weakness.       Memory loss  Psychiatric/Behavioral: Positive for hallucinations, confusion and sleep disturbance (insomnia, daytime sleepiness, sleep talking, acting out dreams).    Objective:  Neurologic Exam  Physical Exam Physical Examination:    Filed Vitals:   03/27/14 1136  BP: 130/71  Pulse: 72   General Examination: The patient is a very pleasant 68 y.o. female in no acute distress. She is situated in her wheelchair.  HEENT: Normocephalic, atraumatic, pupils are equal, round and reactive to light and accommodation. Extraocular tracking shows mild saccadic breakdown without nystagmus noted. There is limitation to upper gaze. There is moderate decrease in eye blink rate. Hearing is intact. Face is symmetric with moderate facial masking and normal facial sensation. There is no lip, neck or jaw tremor. Neck is moderately rigid and she has evidence of cervical dystonia with left neck tilt and slight right turn and mild anterocollis. There are no carotid bruits on auscultation. Oropharynx exam reveals mild mouth dryness. No significant airway crowding is noted. Mallampati is class II. Tongue protrudes centrally and palate elevates symmetrically. There is mild drooling.   Chest: is clear to auscultation without wheezing, rhonchi or crackles noted.  Heart: sounds are regular and normal without murmurs, rubs or gallops noted.   Abdomen: is soft, non-tender and non-distended with normal bowel sounds appreciated on auscultation.  Extremities: There is no pitting edema in the distal lower extremities bilaterally; ankles are puffy. Pedal pulses are intact.  Skin: is warm and dry with no trophic changes noted.  Musculoskeletal: exam reveals no obvious joint deformities, tenderness or joint swelling or erythema.  Neurologically:  Mental status: The patient is awake and alert, paying good  attention. She is able to partially provide the history. Her husband provides details. She is oriented to: person, place, time/date, situation, day of week, month of year and year. Her memory, attention, language and knowledge are impaired mildly. There is no aphasia, agnosia, apraxia or anomia. There is a moderate degree of bradyphrenia. Speech is  moderately hypophonic with mild dysarthria noted. Mood is congruent and affect is normal.   Cranial nerves are as described above under HEENT exam. In addition, shoulder shrug is normal with equal shoulder height noted.  Motor exam: Normal bulk, and strength of 4+/5 globally. There are no dyskinesias noted. She took her medication about 1/2 an hour before the exam. Tone is moderately rigid with absence of cogwheeling in the limbs. There is overall severe bradykinesia. There is no drift or rebound. There is no tremor. Romberg was not  tested. Reflexes are 1+ in the upper extremities and 1+ in the lower extremities. Fine motor skills exam reveals: Finger taps are severely impaired on the right and severely impaired on the left. Hand movements are moderately impaired on the right and moderately impaired on the left. RAP (rapid alternating patting) is moderately impaired on the right and moderately impaired on the left. Foot taps are moderately impaired on the right and moderately impaired on the left. Foot agility (in the form of heel stomping) is moderately impaired on the right and severely impaired on the left.    Cerebellar testing shows no dysmetria or intention tremor on finger to nose testing.   Sensory exam is intact to light touch, PP, temperature and vibration in the UEs and LEs.   Gait, station and balance exam: She was not asked to stand as she did not bring a walker and is at fall risk. She situated in her wheelchair in leans to the left.  Assessment and Plan:    In summary, Margaretmary Prisk is a very pleasant 68 year old female with a history of advanced Parkinson's disease, left-sided predominant, complicated by orthostatic hypotension, hallucinations, dyskinesias, recurrent falls, cervical dystonia, RBD, and mild memory loss. She has remained fairly stable in the last 6 months. She is on Sinemet 25-250 mg strength half a pill 7 times a day. She discontinued amantadine due to leg cramps. She  is asked to continue with her current medications including Sinemet, ProAmatine and Florinef.  I asked her to drink more water and consider using compression stockings. I think it is critical that she uses leg exercises and increase her water intake. I will request a second opinion and advice regarding medication adjustment at [facility]. Given her postural instability and fall risk as well as significant neck dystonia, I do not believe she is an appropriate candidate for DBS surgery but she would like to get evaluated for this as well. She has a friend who recently had a DBS place for Parkinson's disease and had good results but I did ask her to keep in mind that every patient is different and that there is a good chance that she may not be considered a candidate for DBS surgery herself. In the years past Dr. Erling Cruz had told her that she may not be a candidate for DBS. Nevertheless, we can certainly seek a second opinion to help with medication management. She has had side effects to medications in the past and is certainly challenging as she advances with her disease and in age. I did refill her medications including Florinef, Sinemet, ProAmatine. I would like to see her back in 6 months from now, sooner if the need arises and encouraged them to call with any interim questions, concerns, problems or updates and refill requests. They were in agreement.

## 2014-09-26 ENCOUNTER — Telehealth: Payer: Self-pay | Admitting: *Deleted

## 2014-09-26 NOTE — Telephone Encounter (Signed)
Called pt to notify her of Dr. Guadelupe Sabin schedule change. lft message regarding the new date and time of apt and requested she call back if it does not fit her schedule. tef

## 2014-09-29 ENCOUNTER — Ambulatory Visit: Payer: Medicare Other | Admitting: Neurology

## 2014-09-29 ENCOUNTER — Ambulatory Visit (INDEPENDENT_AMBULATORY_CARE_PROVIDER_SITE_OTHER): Payer: Medicare Other | Admitting: Neurology

## 2014-09-29 ENCOUNTER — Encounter: Payer: Self-pay | Admitting: Neurology

## 2014-09-29 VITALS — BP 83/53 | HR 71 | Temp 97.6°F

## 2014-09-29 DIAGNOSIS — I951 Orthostatic hypotension: Secondary | ICD-10-CM

## 2014-09-29 DIAGNOSIS — G249 Dystonia, unspecified: Secondary | ICD-10-CM

## 2014-09-29 DIAGNOSIS — R443 Hallucinations, unspecified: Secondary | ICD-10-CM

## 2014-09-29 DIAGNOSIS — G20 Parkinson's disease: Secondary | ICD-10-CM

## 2014-09-29 DIAGNOSIS — R413 Other amnesia: Secondary | ICD-10-CM

## 2014-09-29 DIAGNOSIS — Z9181 History of falling: Secondary | ICD-10-CM

## 2014-09-29 MED ORDER — CARBIDOPA-LEVODOPA ER 48.75-195 MG PO CPCR: 195.0000 mg | ORAL_CAPSULE | Freq: Four times a day (QID) | ORAL | Status: DC

## 2014-09-29 NOTE — Patient Instructions (Signed)
We will increase your Rytary to 195 mg: take 3 pills 4 times a day, at 7 AM, 11 AM, 3 PM and 2 hours after your evening meal. Follow up in 3 months.

## 2014-09-29 NOTE — Progress Notes (Signed)
Subjective:    Patient ID: Jenny West is a 68 y.o. female.  HPI     Interim history:   Ms. Lindquist is a very pleasant 68 year old right-handed woman with an underlying medical history of recurrent falls with fracture, lower back pain with lumbar radiculopathy, COPD, diplopia, and cancer, who presents for followup consultation of her advanced left-sided predominant Parkinson's disease, complicated by orthostatic hypotension, hallucinations, dyskinesias, sleep disturbance, recurrent falls, cervical dystonia and mild memory loss. She is accompanied by her husband again today. I last saw her on 03/27/14, at which time the patient requested a referral for DBS evaluation and I felt that she was not a surgical treatment, but we mutually agreed to proceed for a second opinion and DBS evaluation. I made a referral to [facility], as per patient's request and she saw Dr. Linus Mako. I reviewed his notes, and appreciate his input. In essence, she had an extensive evaluation and was not deemed a surgical candidate. Most recently she was started on Rytary and also the use of Duopa was discussed with the patient and her husband. Rytary at 145 mg strength 3 pills 3 times a day was not helpful. She was then placed on 195 mg 3 pills 3 times a day which she continues to take currently. Her total dose of levodopa at the very end was 1750 mg total dose. Ryrary at the current dose is 1755 mg total for the day.  Today, she reports that she does not feel better but she feels a little worse even. She feels her dyskinesias are worse at the end of the day. She takes it 3 times a day at 3 pills. She does not think it has been very helpful. She may have some occasional good days however. She eats well. She has no new complaints.  I saw her on 09/26/2013, at which time I asked her to drink more water and do more leg exercises. I did not make changes to her medications and asked her to continue Florinef, Sinemet, ProAmatine and I  referred her to physical therapy.   I first met her on 03/21/13, at which time I felt she was clinically stable and did not make any medication changes but did re-iterate to them the importance of taking the medication on time.   She previously followed with Dr. Morene Antu and was last seen by him on 12/20/2012, at which time he felt that she was overall doing fairly well but still had significant postural hypotension. She had improved dyskinesias and no significant change in her MMSE on amantadine. Her falls assessment tool score was 14 at the time. Dr. Erling Cruz talked to her about trying a compression hose and increased her midodrine to 5 mg 3 times a day and continued her on Florinef 0.1 mg daily. He requested a BMP at the time, which was unremarkable. In February after a phone conversation Dr. Erling Cruz increased her amantadine to 50 mg twice daily from once daily due to increase in dyskinesias.    She has been on Amantadine 50 mg twice daily, Florinef 0.1 mg once daily, Zyrtec once daily, Sinemet 25-250 mg strength half a tablet 7 times a day, starting at 7:30 AM, then 9:30 AM, 11:30 AM, 1:30 PM, 3:30 PM, 5 PM and 7 PM. Midodrine 5 mg 2 times a day, omeprazole 20 mg 2 by mouth daily, calcium, multivitamin.   She was diagnosed with Parkinson's disease in 1991 at which time she had difficulty using her left hand. She was placed  on Eldepryl and Sinemet. She was first seen by Dr. Erling Cruz in May 1994. She had side effects from medication at the time and had side effects on Permax before then. In December 1995 Parlodel was added. She was on this for several years but developed shortness of breath in 2004 and was tapered off of that medication. She was diagnosed with COPD and in 2008 developed postural hypotension and was started on midodrine after which Florinef was added. She has had motor fluctuations and freezing, postural hypotension and dyskinesias as well as late afternoon hallucinations. She has fallen repeatedly. She  does need help with ADLs. She used a walker until December 2013. She fell in September and December 2013 and fractured her left knee. She does not have any history of impulse control disorder. She has urinary incontinence. She has had double vision treated with prisms. In December 2013 her MMSE was 28, clock drawing was 4, and AFT was 8. In January 2014 her MMSE was 27, clock drawing was 4/4, and animal fluency was 13. CT C-spine in January showed no fracture and mild to moderate degenerative changes.      Her Past Medical History Is Significant For: Past Medical History  Diagnosis Date  . Parkinson's disease   . Dyskinesia   . Orthostatic hypotension   . Reflux   . Headache(784.0)   . Cancer   . COPD (chronic obstructive pulmonary disease)   . Knee fracture     left    Her Past Surgical History Is Significant For: Past Surgical History  Procedure Laterality Date  . Partial hip arthroplasty Left     Her Family History Is Significant For: Family History  Problem Relation Age of Onset  . Pneumonia Mother   . Heart disease Father   . Cancer Brother   . Cancer Paternal Uncle     Her Social History Is Significant For: History   Social History  . Marital Status: Married    Spouse Name: Jeneen Rinks    Number of Children: 3  . Years of Education: MA   Occupational History  .     Social History Main Topics  . Smoking status: None  . Smokeless tobacco: Never Used  . Alcohol Use: No  . Drug Use: No  . Sexual Activity: None   Other Topics Concern  . None   Social History Narrative   Patient lives at home with family.   Caffeine Use: none    Her Allergies Are:  Allergies  Allergen Reactions  . Morphine And Related Anaphylaxis  . Codeine   . Iodine   . Latex   :   Her Current Medications Are:  Outpatient Encounter Prescriptions as of 09/29/2014  Medication Sig  . Carbidopa-Levodopa ER (RYTARY) 48.75-195 MG CPCR Take 195 mg by mouth 3 (three) times daily. ER, 3  capsules three times daily  . midodrine (PROAMATINE) 5 MG tablet 5 mg.  Vladimir Faster Glycol-Propyl Glycol (SYSTANE OP) Apply 1 drop to eye daily.  Marland Kitchen alendronate (FOSAMAX) 70 MG tablet Take 1 tablet by mouth once a week.  . calcium carbonate (OS-CAL) 600 MG TABS tablet Take 600 mg by mouth. Every other day  . fish oil-omega-3 fatty acids 1000 MG capsule Take 2 g by mouth every other day.   . fludrocortisone (FLORINEF) 0.1 MG tablet Take 1 tablet (0.1 mg total) by mouth daily.  Marland Kitchen loratadine (CLARITIN) 10 MG tablet Take 10 mg by mouth daily.  . Multiple Vitamins-Minerals (MULTIVITAMIN PO) Take 1  tablet by mouth daily.  Marland Kitchen omeprazole (PRILOSEC) 20 MG capsule Take 20 mg by mouth daily.  . simvastatin (ZOCOR) 20 MG tablet daily.   Marland Kitchen ZYLET 0.5-0.3 % SUSP Apply 1 drop to eye daily. As needed  . [DISCONTINUED] aspirin 81 MG tablet Take 81 mg by mouth daily.  . [DISCONTINUED] carbidopa-levodopa (SINEMET IR) 25-250 MG per tablet TAKE 1/2 TABLET BY MOUTH SEVEN TIMES A DAY AS DIRECTED  . [DISCONTINUED] doxycycline (VIBRAMYCIN) 50 MG capsule Take 1 capsule by mouth daily.  . [DISCONTINUED] midodrine (PROAMATINE) 5 MG tablet Take 1 tablet (5 mg total) by mouth 2 (two) times daily with a meal.  :  Review of Systems:  Out of a complete 14 point review of systems, all are reviewed and negative with the exception of these symptoms as listed below:   Review of Systems  Eyes: Positive for discharge.  Musculoskeletal: Positive for gait problem.  Neurological: Positive for dizziness, speech difficulty and weakness.       Sleep talking    Objective:  Neurologic Exam  Physical Exam Physical Examination:   Filed Vitals:   09/29/14 1154  BP: 83/53  Pulse: 71  Temp: 97.6 F (36.4 C)    General Examination: The patient is a very pleasant 68 y.o. female in no acute distress. She is situated in her wheelchair.   HEENT: Normocephalic, atraumatic, pupils are equal, round and reactive to light and  accommodation. Extraocular tracking shows moderate saccadic breakdown without nystagmus noted. She is status post bilateral cataract repairs recently. There is limitation to upper gaze. There is moderate decrease in eye blink rate. Hearing is intact. Face is symmetric with severe facial masking and normal facial sensation. There is no lip, neck or jaw tremor. Neck is moderately rigid and she has evidence of cervical dystonia with left neck tilt and slight right turn and mild anterocollis. There are no carotid bruits on auscultation. Oropharynx exam reveals mild mouth dryness. No significant airway crowding is noted. Mallampati is class II. Tongue protrudes centrally and palate elevates symmetrically. There is mild drooling.   Chest: is clear to auscultation without wheezing, rhonchi or crackles noted.  Heart: sounds are regular and normal without murmurs, rubs or gallops noted.   Abdomen: is soft, non-tender and non-distended with normal bowel sounds appreciated on auscultation.  Extremities: There is no pitting edema in the distal lower extremities bilaterally; ankles are puffy. Pedal pulses are intact.  Skin: is warm and dry with no trophic changes noted.  Musculoskeletal: exam reveals no obvious joint deformities, tenderness or joint swelling or erythema.  Neurologically:  Mental status: The patient is awake and alert, paying good  attention. She is able to partially provide the history. Her husband provides details. She is oriented to: person, place, time/date, situation, day of week, month of year and year. Her memory, attention, language and knowledge are impaired mildly. There is no aphasia, agnosia, apraxia or anomia. There is a moderate degree of bradyphrenia. Speech is moderately hypophonic with mild dysarthria noted. Mood is congruent and affect is normal.  09/29/2014: MMSE is 26, clock drawing is 3, animal fluency is 12.  Cranial nerves are as described above under HEENT exam. In  addition, shoulder shrug is normal with equal shoulder height noted.  Motor exam: Normal bulk, and strength of 4+/5 globally. There are no dyskinesias noted. Tone is moderately rigid with absence of cogwheeling in the limbs. There is overall severe bradykinesia. There is no drift or rebound. There is no tremor. Romberg  was not tested. Reflexes are 1+ in the upper extremities and 1+ in the lower extremities. Fine motor skills exam reveals: Finger taps are severely impaired on the right and severely impaired on the left. Hand movements are moderately impaired on the right and moderately impaired on the left. RAP (rapid alternating patting) is moderately impaired on the right and moderately impaired on the left. Foot taps are moderately impaired on the right and moderately impaired on the left. Foot agility (in the form of heel stomping) is moderately impaired on the right and severely impaired on the left.    Cerebellar testing shows no dysmetria or intention tremor on finger to nose testing.   Sensory exam is intact to light touch, PP, temperature and vibration in the UEs and LEs.   Gait, station and balance exam: She was not asked to stand as she did not bring a walker and is at fall risk. She situated in her wheelchair and leans to the left.  Assessment and Plan:    In summary, Betzabe Bevans is a very pleasant 68 year old female with a history of advanced Parkinson's disease, left-sided predominant, complicated by orthostatic hypotension, hallucinations, dyskinesias, recurrent falls, cervical dystonia, RBD, and mild memory loss. She was recently seen by Dr. Linus Mako at [facility] and I greatly appreciate his input and expertise. She was not deemed a surgical candidate for Parkinson's treatment. She has been placed on Rytary 195 mg 3 pills tid and has not noticed any improvement. She has had side effects with medications in the past. She is able to tolerated. Her total levodopa toes at the  maximum was 1750 mg. To that end, she will need a higher dose of right are in my opinion. Her total dose with this new medication is approximately 1755 mg. I would like to increase this. I think the easiest thing to do would be to increase it to 3 pills 4 times a day. I reviewed the medication times with her and asked him to try to take the medication away from her mealtimes for better absorption. She may need Higher dose which would be 245 mg strength. She will see Dr. Linus Mako in February. I would like to see her back in a couple months for recheck. We will keep the midodrine and her Florinef the same. I asked her to drink more water. I encouraged them to email or call me for questions. I adjusted her prescription for Rytary to 195 mg 3 pills qid, at 7, 11, 3 PM and approximately 8 or 9 PM. I answered all her questions today and the patient and her husband were in agreement.

## 2014-10-02 ENCOUNTER — Ambulatory Visit: Payer: Medicare Other | Admitting: Neurology

## 2014-10-25 ENCOUNTER — Other Ambulatory Visit: Payer: Self-pay | Admitting: Neurology

## 2014-11-10 ENCOUNTER — Other Ambulatory Visit: Payer: Self-pay | Admitting: Neurology

## 2014-12-01 ENCOUNTER — Encounter: Payer: Self-pay | Admitting: Neurology

## 2014-12-01 ENCOUNTER — Ambulatory Visit: Payer: Medicare Other | Admitting: Neurology

## 2014-12-01 ENCOUNTER — Telehealth: Payer: Self-pay | Admitting: Neurology

## 2014-12-01 NOTE — Telephone Encounter (Signed)
Patient is a no show for today's(12/01/14) appointment at 11:00

## 2014-12-15 ENCOUNTER — Encounter: Payer: Self-pay | Admitting: Neurology

## 2014-12-15 ENCOUNTER — Ambulatory Visit (INDEPENDENT_AMBULATORY_CARE_PROVIDER_SITE_OTHER): Payer: Medicare Other | Admitting: Neurology

## 2014-12-15 VITALS — BP 117/67 | HR 82 | Temp 98.4°F | Ht 66.0 in | Wt 149.0 lb

## 2014-12-15 DIAGNOSIS — G20 Parkinson's disease: Secondary | ICD-10-CM

## 2014-12-15 DIAGNOSIS — R413 Other amnesia: Secondary | ICD-10-CM

## 2014-12-15 DIAGNOSIS — G249 Dystonia, unspecified: Secondary | ICD-10-CM

## 2014-12-15 DIAGNOSIS — I951 Orthostatic hypotension: Secondary | ICD-10-CM

## 2014-12-15 DIAGNOSIS — R296 Repeated falls: Secondary | ICD-10-CM

## 2014-12-15 MED ORDER — CARBIDOPA-LEVODOPA ER 48.75-195 MG PO CPCR: 585.0000 mg | ORAL_CAPSULE | Freq: Four times a day (QID) | ORAL | Status: DC

## 2014-12-15 NOTE — Patient Instructions (Signed)
We will try to go up with the Rytary to 3 pills 4 times a day and see how it goes.  Follow up in 4 months.

## 2014-12-15 NOTE — Progress Notes (Signed)
Subjective:    Patient ID: Jenny West is a 69 y.o. female.  HPI     Interim history:   Jenny West is a very pleasant 69 year old right-handed woman with an underlying medical history of recurrent falls with fracture, lower back pain with lumbar radiculopathy, COPD, diplopia, and cancer, who presents for followup consultation of her advanced left-sided predominant Parkinson's disease, complicated by orthostatic hypotension, hallucinations, dyskinesias, sleep disturbance, recurrent falls, cervical dystonia and mild memory loss. She is accompanied by her husband again today. I last saw her on 09/29/2014, at which time she reported feeling a little worse. She had been changed to Rytary, from a total dose of levodopa at 1750 mg to a total dose of Rytary of 1755 mg daily. We talked about her visit with Dr. Linus Mako. She was not deemed a surgical candidate for DBS placement. I increased her Rytary to 3 pills 4 times a day. She missed an appointment on 12/01/14.  Today, her husband reports, that she never tried the increased dose of Rytary, as her insurance did not approve of the new prescription. She has in fact cut down to 195 mg 3 - 2 - 2, as she was having more dyskinesias more significant off time. She has not fallen recently and spends more time in the [facility]. She has a lady come in daily from 8A-2P. She is by herself from 2 PM to 6 PM. He can be home in 10 min. He works FT, co-owns [employer].   I saw her on 03/27/14, at which time the patient requested a referral for DBS evaluation and I felt that she was not a surgical treatment, but we mutually agreed to proceed for a second opinion and DBS evaluation. I made a referral to [facility], as per patient's request and she saw Dr. Linus Mako. I reviewed his notes, and appreciate his input. In essence, she had an extensive evaluation and was not deemed a surgical candidate. Most recently she was started on Rytary and also the use of Duopa was  discussed with the patient and her husband. Rytary at 145 mg strength 3 pills 3 times a day was not helpful. She was then placed on 195 mg 3 pills 3 times a day which she continues to take currently. Her total dose of levodopa at the very end was 1750 mg total dose. Ryrary at the current dose is 1755 mg total for the day.    I saw her on 09/26/2013, at which time I asked her to drink more water and do more leg exercises. I did not make changes to her medications and asked her to continue Florinef, Sinemet, ProAmatine and I referred her to physical therapy.   I first met her on 03/21/13, at which time I felt she was clinically stable and did not make any medication changes but did re-iterate to them the importance of taking the medication on time.   She previously followed with Dr. Morene Antu and was last seen by him on 12/20/2012, at which time he felt that she was overall doing fairly well but still had significant postural hypotension. She had improved dyskinesias and no significant change in her MMSE on amantadine. Her falls assessment tool score was 14 at the time. Dr. Erling Cruz talked to her about trying a compression hose and increased her midodrine to 5 mg 3 times a day and continued her on Florinef 0.1 mg daily. He requested a BMP at the time, which was unremarkable. In February after a phone conversation  Dr. Erling Cruz increased her amantadine to 50 mg twice daily from once daily due to increase in dyskinesias.    She has been on Amantadine 50 mg twice daily, Florinef 0.1 mg once daily, Zyrtec once daily, Sinemet 25-250 mg strength half a tablet 7 times a day, starting at 7:30 AM, then 9:30 AM, 11:30 AM, 1:30 PM, 3:30 PM, 5 PM and 7 PM. Midodrine 5 mg 2 times a day, omeprazole 20 mg 2 by mouth daily, calcium, multivitamin.   She was diagnosed with Parkinson's disease in 1991 at which time she had difficulty using her left hand. She was placed on Eldepryl and Sinemet. She was first seen by Dr. Erling Cruz in May 1994.  She had side effects from medication at the time and had side effects on Permax before then. In December 1995 Parlodel was added. She was on this for several years but developed shortness of breath in 2004 and was tapered off of that medication. She was diagnosed with COPD and in 2008 developed postural hypotension and was started on midodrine after which Florinef was added. She has had motor fluctuations and freezing, postural hypotension and dyskinesias as well as late afternoon hallucinations. She has fallen repeatedly. She does need help with ADLs. She used a walker until December 2013. She fell in September and December 2013 and fractured her left knee. She does not have any history of impulse control disorder. She has urinary incontinence. She has had double vision treated with prisms. In December 2013 her MMSE was 28, clock drawing was 4, and AFT was 8. In January 2014 her MMSE was 27, clock drawing was 4/4, and animal fluency was 13. CT C-spine in January showed no fracture and mild to moderate degenerative changes.    Her Past Medical History Is Significant For: Past Medical History  Diagnosis Date  . Parkinson's disease   . Dyskinesia   . Orthostatic hypotension   . Reflux   . Headache(784.0)   . Cancer   . COPD (chronic obstructive pulmonary disease)   . Knee fracture     left    Her Past Surgical History Is Significant For: Past Surgical History  Procedure Laterality Date  . Partial hip arthroplasty Left     Her Family History Is Significant For: Family History  Problem Relation Age of Onset  . Pneumonia Mother   . Heart disease Father   . Cancer Brother   . Cancer Paternal Uncle     Her Social History Is Significant For: History   Social History  . Marital Status: Married    Spouse Name: Jenny West    Number of Children: 3  . Years of Education: MA   Occupational History  .     Social History Main Topics  . Smoking status: Never Smoker   . Smokeless tobacco: Never  Used  . Alcohol Use: No  . Drug Use: No  . Sexual Activity: None   Other Topics Concern  . None   Social History Narrative   Patient lives at home with family.   Caffeine Use: none    Her Allergies Are:  Allergies  Allergen Reactions  . Morphine And Related Anaphylaxis  . Amantadine Other (See Comments)    Worsening of dyskinesia  . Codeine   . Iodine   . Latex   . Phenylpropanolamine Other (See Comments)    "KNOCKS ME OUT"  . Povidone-Iodine Itching and Rash    REDNESS  :   Her Current Medications Are:  Outpatient  Encounter Prescriptions as of 12/15/2014  Medication Sig  . alendronate (FOSAMAX) 70 MG tablet Take 1 tablet by mouth once a week.  . Calcium Carb-Cholecalciferol 500-125 MG-UNIT TABS Take by mouth.  . calcium carbonate (OS-CAL) 600 MG TABS tablet Take 600 mg by mouth. Every other day  . Carbidopa-Levodopa ER 48.75-195 MG CPCR Take by mouth 3 (three) times daily. 3 tablets at 8:00am,2 tablets at 1:00 pm, 2 tablets at supper time  . fish oil-omega-3 fatty acids 1000 MG capsule Take 2 g by mouth every other day.   . fludrocortisone (FLORINEF) 0.1 MG tablet TAKE 1 TABLET BY MOUTH EVERY DAY  . ibuprofen (ADVIL,MOTRIN) 600 MG tablet Take by mouth.  . loratadine (CLARITIN) 10 MG tablet Take 10 mg by mouth daily.  . midodrine (PROAMATINE) 5 MG tablet 5 mg.  . midodrine (PROAMATINE) 5 MG tablet TAKE 1 TABLET BY MOUTH TWICE A DAY WITH A MEAL  . Multiple Vitamins-Minerals (MULTIVITAMIN PO) Take 1 tablet by mouth daily.  Marland Kitchen omeprazole (PRILOSEC) 20 MG capsule Take 20 mg by mouth daily.  . simvastatin (ZOCOR) 20 MG tablet daily.   . [DISCONTINUED] Carbidopa-Levodopa ER (RYTARY) 48.75-195 MG CPCR Take 195 mg by mouth 3 (three) times daily. ER, 3 capsules three times daily  . [DISCONTINUED] Carbidopa-Levodopa ER (RYTARY) 48.75-195 MG CPCR Take 195 mg by mouth 4 (four) times daily. (Patient not taking: Reported on 12/15/2014)  . [DISCONTINUED] Polyethyl Glycol-Propyl Glycol  (SYSTANE OP) Apply 1 drop to eye daily.  . [DISCONTINUED] ZYLET 0.5-0.3 % SUSP Apply 1 drop to eye daily. As needed  :  Review of Systems:  Out of a complete 14 point review of systems, all are reviewed and negative with the exception of these symptoms as listed below:   Review of Systems  Neurological: Positive for weakness.    Objective:  Neurologic Exam  Physical Exam Physical Examination:   Filed Vitals:   12/15/14 1304  BP: 117/67  Pulse: 82  Temp: 98.4 F (36.9 C)    General Examination: The patient is a very pleasant 69 y.o. female in no acute distress. She is situated in her wheelchair.   HEENT: Normocephalic, atraumatic, pupils are equal, round and reactive to light and accommodation. Extraocular tracking shows moderate saccadic breakdown without nystagmus noted. She is status post bilateral cataract repairs recently. There is limitation to upper gaze. There is moderate decrease in eye blink rate. Hearing is intact. Face is symmetric with severe facial masking and normal facial sensation. There is no lip, neck or jaw tremor. Neck is moderately rigid and she has evidence of cervical dystonia with left neck tilt and slight right turn and mild anterocollis. There are no carotid bruits on auscultation. Oropharynx exam reveals mild mouth dryness. No significant airway crowding is noted. Mallampati is class II. Tongue protrudes centrally and palate elevates symmetrically. There is mild drooling.   Chest: is clear to auscultation without wheezing, rhonchi or crackles noted.  Heart: sounds are regular and normal without murmurs, rubs or gallops noted.   Abdomen: is soft, non-tender and non-distended with normal bowel sounds appreciated on auscultation.  Extremities: There is no pitting edema in the distal lower extremities bilaterally; ankles are puffy. Pedal pulses are intact.  Skin: is warm and dry with no trophic changes noted.  Musculoskeletal: exam reveals no obvious  joint deformities, tenderness or joint swelling or erythema.  Neurologically:  Mental status: The patient is awake and alert, paying good  attention. She is able to partially provide the  history. Her husband provides details. She is oriented to: person, place, time/date, situation, day of week, month of year and year. Her memory, attention, language and knowledge are impaired mildly. There is no aphasia, agnosia, apraxia or anomia. There is a moderate degree of bradyphrenia. Speech is moderately hypophonic with mild dysarthria noted. Mood is congruent and affect is normal.  On 09/29/2014: MMSE is 26/30, clock drawing is 3/4, animal fluency is 12/min.  Cranial nerves are as described above under HEENT exam. In addition, shoulder shrug is normal with equal shoulder height noted.  Motor exam: Normal bulk, and strength of 4+/5 globally. There are no dyskinesias noted. Tone is moderately rigid with absence of cogwheeling in the limbs. There is overall severe bradykinesia. There is no drift or rebound. There is no tremor. Romberg was not tested. Reflexes are 1+ in the upper extremities and 1+ in the lower extremities. Fine motor skills exam reveals: Finger taps are severely impaired on the right and severely impaired on the left. Hand movements are moderately impaired on the right and moderately impaired on the left. RAP (rapid alternating patting) is moderately impaired on the right and moderately impaired on the left. Foot taps are moderately impaired on the right and moderately impaired on the left. Foot agility (in the form of heel stomping) is moderately impaired on the right and severely impaired on the left.    Cerebellar testing shows no dysmetria or intention tremor on finger to nose testing.   Sensory exam is intact to light touch, PP, temperature and vibration in the UEs and LEs.   Gait, station and balance exam: She was not asked to stand as she did not bring a walker and is at fall risk. She  situated in her wheelchair and leans to the left.  Assessment and Plan:    In summary, Jenny West is a very pleasant 69 year old female with a history of advanced Parkinson's disease, left-sided predominant, complicated by orthostatic hypotension, hallucinations, dyskinesias, recurrent falls, cervical dystonia, RBD, and memory loss. She was seen by Dr. Linus Mako at [facility] last year and I greatly appreciate his input and expertise. She was not deemed a surgical candidate for Parkinson's treatment, which we talked about in the recent past. She was changed to Rytary 195 mg 3 pills tid and had not noticed any improvement. She has had side effects with medications in the past. She was able to tolerate it and I had suggested she increase the medication to 3 pills 4 times a day, but due to some miscommunication she was not able to fill it that way. Therefore she was taking it 3 pills 3 times a day and in fact reduced to 3 pills in the morning and 2 pills midday and 2 pills in the evening because of more dyskinesias noted. I would still like for her to try the higher dose of possible to see if she improves in her mobility. She is limited in her mobility to begin with and we will have to weigh really carefully how much benefit she really gets from the increase in her medication. Again, I provided her with a new paper prescription for rytary 195 mg, 3 pills 4 times a day. If she consistently has more dyskinesias on this dose she will have to cut back again. Our options unfortunately are limited at this time and she and her husband understand this. She has an appointment with Dr. Linus Mako next month and I will see her back routinely after that.  We will keep the midodrine and her Florinef the same. I asked her to drink more water. I encouraged them to email or call me for questions. She brought in a bottle of a new supplements she would like to start. I looked at the ingredients, some of the herbal  ingredients I do not recognize, but for the most part uses a multivitamin with reticulated be vitamins. I did not see any reason not to try this but did ask her to look out for any problems with tolerance or reactions as any new medication even a supplement can cause a reaction or rash. I answered all her questions today and the patient and her husband were in agreement.

## 2015-04-16 ENCOUNTER — Ambulatory Visit (INDEPENDENT_AMBULATORY_CARE_PROVIDER_SITE_OTHER): Payer: Medicare Other | Admitting: Neurology

## 2015-04-16 ENCOUNTER — Encounter: Payer: Self-pay | Admitting: Neurology

## 2015-04-16 VITALS — BP 116/72 | HR 76 | Resp 12

## 2015-04-16 DIAGNOSIS — R296 Repeated falls: Secondary | ICD-10-CM | POA: Diagnosis not present

## 2015-04-16 DIAGNOSIS — G20 Parkinson's disease: Secondary | ICD-10-CM | POA: Diagnosis not present

## 2015-04-16 DIAGNOSIS — G249 Dystonia, unspecified: Secondary | ICD-10-CM | POA: Diagnosis not present

## 2015-04-16 DIAGNOSIS — R413 Other amnesia: Secondary | ICD-10-CM | POA: Diagnosis not present

## 2015-04-16 DIAGNOSIS — I951 Orthostatic hypotension: Secondary | ICD-10-CM

## 2015-04-16 DIAGNOSIS — R443 Hallucinations, unspecified: Secondary | ICD-10-CM | POA: Diagnosis not present

## 2015-04-16 MED ORDER — FLUDROCORTISONE ACETATE 0.1 MG PO TABS: 100.0000 ug | ORAL_TABLET | Freq: Every day | ORAL | Status: DC

## 2015-04-16 MED ORDER — MIDODRINE HCL 5 MG PO TABS: ORAL_TABLET | ORAL | Status: DC

## 2015-04-16 MED ORDER — CARBIDOPA-LEVODOPA ER 48.75-195 MG PO CPCR: 390.0000 mg | ORAL_CAPSULE | Freq: Four times a day (QID) | ORAL | Status: DC

## 2015-04-16 NOTE — Progress Notes (Signed)
Subjective:    Patient ID: Jenny West is a 69 y.o. female.  HPI     Interim history:   Jenny West is a very pleasant 69 year old right-handed woman with an underlying medical history of recurrent falls with fracture, lower back pain with lumbar radiculopathy, COPD, diplopia, and cancer, who presents for followup consultation of her advanced left-sided predominant Parkinson's disease, complicated by orthostatic hypotension, hallucinations, dyskinesias, sleep disturbance, recurrent falls, cervical dystonia and mild memory loss. She is accompanied by her husband again today. I last saw her on 12/15/2014, at which time her husband reported that they had cut down on the new medication, Rytary, as her insurance did not approve the new prescription. She has was taking 195 mg 3 - 2 - 2, as she was having more dyskinesias and more significant off time. She had not fallen recently but was more and more in the wheelchair. They have help daily from 8 AM to 2 PM. She was by herself usually from 2 PM to 6 PM. He works full-time, and [employer] but works very close to home. I suggested we try Rytary 3 pills 4 times a day.  Today, 04/16/2015: She reports having allergies and tearing of her eyes. Her husband provides most of her history. He reports no falls, still some hallucinations. She sleeps in her lift chair. He sleeps upstairs and uses a baby monitor to listen for her. She fell out of her lift chair recently and had a bruise on her left shoulder. She is not drinking enough water. They have 3 grown sons, 9 grand children. She has a BM about every 3 days, but hard. She has helped for 6 hours in the mornings. Jenny West helps her with her pills in the mornings and midday. She takes 2 pills 4 times a day of the Rytary. This seems to be working reasonably well for her at this time. She has an appointment at [facility] for ophthalmology and also sees Dr. Linus Mako.  Previously:   I saw  her on 09/29/2014, at which time she reported feeling a little worse. She had been changed to Rytary, from a total dose of levodopa at 1750 mg to a total dose of Rytary of 1755 mg daily. We talked about her visit with Dr. Linus Mako. She was not deemed a surgical candidate for DBS placement. I increased her Rytary to 3 pills 4 times a day. She missed an appointment on 12/01/14.  I saw her on 03/27/14, at which time the patient requested a referral for DBS evaluation and I felt that she was not a surgical treatment, but we mutually agreed to proceed for a second opinion and DBS evaluation. I made a referral to [facility], as per patient's request and she saw Dr. Linus Mako. I reviewed his notes, and appreciate his input. In essence, she had an extensive evaluation and was not deemed a surgical candidate. Most recently she was started on Rytary and also the use of Duopa was discussed with the patient and her husband. Rytary at 145 mg strength 3 pills 3 times a day was not helpful. She was then placed on 195 mg 3 pills 3 times a day which she continues to take currently. Her total dose of levodopa at the very end was 1750 mg total dose. Ryrary at the current dose is 1755 mg total for the day.   I saw her on 09/26/2013, at which time I asked her to drink more water and do more leg exercises. I did  not make changes to her medications and asked her to continue Florinef, Sinemet, ProAmatine and I referred her to physical therapy.   I first met her on 03/21/13, at which time I felt she was clinically stable and did not make any medication changes but did re-iterate to them the importance of taking the medication on time.   She previously followed with Dr. Morene Antu and was last seen by him on 12/20/2012, at which time he felt that she was overall doing fairly well but still had significant postural hypotension. She had improved dyskinesias and no significant change in her MMSE on amantadine. Her falls assessment tool score was 14  at the time. Dr. Erling Cruz talked to her about trying a compression hose and increased her midodrine to 5 mg 3 times a day and continued her on Florinef 0.1 mg daily. He requested a BMP at the time, which was unremarkable. In February after a phone conversation Dr. Erling Cruz increased her amantadine to 50 mg twice daily from once daily due to increase in dyskinesias.    She has been on Amantadine 50 mg twice daily, Florinef 0.1 mg once daily, Zyrtec once daily, Sinemet 25-250 mg strength half a tablet 7 times a day, starting at 7:30 AM, then 9:30 AM, 11:30 AM, 1:30 PM, 3:30 PM, 5 PM and 7 PM. Midodrine 5 mg 2 times a day, omeprazole 20 mg 2 by mouth daily, calcium, multivitamin.   She was diagnosed with Parkinson's disease in 1991 at which time she had difficulty using her left hand. She was placed on Eldepryl and Sinemet. She was first seen by Dr. Erling Cruz in May 1994. She had side effects from medication at the time and had side effects on Permax before then. In December 1995 Parlodel was added. She was on this for several years but developed shortness of breath in 2004 and was tapered off of that medication. She was diagnosed with COPD and in 2008 developed postural hypotension and was started on midodrine after which Florinef was added. She has had motor fluctuations and freezing, postural hypotension and dyskinesias as well as late afternoon hallucinations. She has fallen repeatedly. She does need help with ADLs. She used a walker until December 2013. She fell in September and December 2013 and fractured her left knee. She does not have any history of impulse control disorder. She has urinary incontinence. She has had double vision treated with prisms. In December 2013 her MMSE was 28, clock drawing was 4, and AFT was 8. In January 2014 her MMSE was 27, clock drawing was 4/4, and animal fluency was 13. CT C-spine in January showed no fracture and mild to moderate degenerative changes.    Her Past Medical History Is  Significant For: Past Medical History  Diagnosis Date  . Parkinson's disease   . Dyskinesia   . Orthostatic hypotension   . Reflux   . Headache(784.0)   . Cancer   . COPD (chronic obstructive pulmonary disease)   . Knee fracture     left    Her Past Surgical History Is Significant For: Past Surgical History  Procedure Laterality Date  . Partial hip arthroplasty Left     Her Family History Is Significant For: Family History  Problem Relation Age of Onset  . Pneumonia Mother   . Heart disease Father   . Cancer Brother   . Cancer Paternal Uncle     Her Social History Is Significant For: History   Social History  . Marital Status:  Married    Spouse Name: Jeneen Rinks  . Number of Children: 3  . Years of Education: MA   Occupational History  .     Social History Main Topics  . Smoking status: Never Smoker   . Smokeless tobacco: Never Used  . Alcohol Use: No  . Drug Use: No  . Sexual Activity: Not on file   Other Topics Concern  . None   Social History Narrative   Patient lives at home with family.   Caffeine Use: none    Her Allergies Are:  Allergies  Allergen Reactions  . Morphine And Related Anaphylaxis  . Amantadine Other (See Comments)    Worsening of dyskinesia  . Codeine   . Iodine   . Latex   . Phenylpropanolamine Other (See Comments)    "KNOCKS ME OUT"  . Povidone-Iodine Itching and Rash    REDNESS  :   Her Current Medications Are:  Outpatient Encounter Prescriptions as of 04/16/2015  Medication Sig  . Calcium Carb-Cholecalciferol 500-125 MG-UNIT TABS Take by mouth.  . calcium carbonate (OS-CAL) 600 MG TABS tablet Take 600 mg by mouth. Every other day  . Carbidopa-Levodopa ER (RYTARY) 48.75-195 MG CPCR Take 585 mg by mouth 4 (four) times daily. Take 3 pills 4 times day. (Patient taking differently: Take 585 mg by mouth 4 (four) times daily. Take 2 caps 4x a day)  . fish oil-omega-3 fatty acids 1000 MG capsule Take 2 g by mouth every other day.    . fludrocortisone (FLORINEF) 0.1 MG tablet TAKE 1 TABLET BY MOUTH EVERY DAY  . loratadine (CLARITIN) 10 MG tablet Take 10 mg by mouth daily.  . metroNIDAZOLE (METROCREAM) 0.75 % cream   . midodrine (PROAMATINE) 5 MG tablet 5 mg.  . midodrine (PROAMATINE) 5 MG tablet TAKE 1 TABLET BY MOUTH TWICE A DAY WITH A MEAL  . Multiple Vitamins-Minerals (MULTIVITAMIN PO) Take 1 tablet by mouth daily.  Marland Kitchen omeprazole (PRILOSEC) 20 MG capsule Take 20 mg by mouth daily.  . simvastatin (ZOCOR) 20 MG tablet daily.   . [DISCONTINUED] Carbidopa-Levodopa ER 48.75-195 MG CPCR Take by mouth 3 (three) times daily. 3 tablets at 8:00am,2 tablets at 1:00 pm, 2 tablets at supper time  . [DISCONTINUED] alendronate (FOSAMAX) 70 MG tablet Take 1 tablet by mouth once a week.  . [DISCONTINUED] ibuprofen (ADVIL,MOTRIN) 600 MG tablet Take by mouth.   No facility-administered encounter medications on file as of 04/16/2015.  :  Review of Systems:  Out of a complete 14 point review of systems, all are reviewed and negative with the exception of these symptoms as listed below:   Review of Systems  All other systems reviewed and are negative.   Objective:  Neurologic Exam  Physical Exam Physical Examination:   Filed Vitals:   04/16/15 1108  BP: 116/72  Pulse: 76  Resp: 12    General Examination: The patient is a very pleasant 69 y.o. female in no acute distress. She is situated in her wheelchair.   HEENT: Normocephalic, atraumatic, pupils are equal, round and reactive to light and accommodation. she has mild redness at the right lower eyelid. She has excess tearing. She continually dabs her eyes, particularly her right. Extraocular tracking shows moderate saccadic breakdown without nystagmus noted. She is status post bilateral cataract repairs recently. There is limitation to upper gaze. There is moderate decrease in eye blink rate. Hearing is intact. Face is symmetric with severe facial masking and normal facial  sensation. There is no lip,  neck or jaw tremor. Neck is moderately rigid and she has evidence of cervical dystonia with left neck tilt and slight right turn and mild anterocollis. There are no carotid bruits on auscultation. Oropharynx exam reveals mild mouth dryness. No significant airway crowding is noted. Mallampati is class II. Tongue protrudes centrally and palate elevates symmetrically. There is mild drooling.   Chest: is clear to auscultation without wheezing, rhonchi or crackles noted.  Heart: sounds are regular and normal without murmurs, rubs or gallops noted.   Abdomen: is soft, non-tender and non-distended with normal bowel sounds appreciated on auscultation.  Extremities: There is no pitting edema in the distal lower extremities bilaterally; ankles are  not puffy today. Pedal pulses are intact.  Skin: is warm and dry with no trophic changes noted.  Musculoskeletal: exam reveals no obvious joint deformities, tenderness or joint swelling or erythema.  Neurologically:  Mental status: The patient is awake and alert, paying good  attention. She is able to partially provide the history. Her husband provides details. She is oriented to: person, place, time/date, situation, day of week, month of year and year. Her memory, attention, language and knowledge are impaired mildly. There is no aphasia, agnosia, apraxia or anomia. There is a moderate degree of bradyphrenia. Speech is moderately hypophonic with mild dysarthria noted. Mood is congruent and affect is normal.   On 09/29/2014: MMSE is 26/30, clock drawing is 3/4, animal fluency is 12/min.  On 04/16/2015: MMSE: 28/30, CDT: 2/4, AFT: 5/min.  Cranial nerves are as described above under HEENT exam. In addition, shoulder shrug is normal with equal shoulder height noted.  Motor exam: Normal bulk, and strength of 4+/5 globally. There are  mild intermittent lower body dyskinesias noted. Tone is moderately rigid with absence of cogwheeling in  the limbs. There is overall severe bradykinesia. There is no drift or rebound. There is no tremor. Romberg was not tested. Reflexes are 1+ in the upper extremities and 1+ in the lower extremities. Fine motor skills exam reveals: Finger taps are severely impaired on the right and severely impaired on the left. Hand movements are moderately impaired on the right and moderately impaired on the left. RAP (rapid alternating patting) is moderately impaired on the right and moderately impaired on the left. Foot taps are moderately impaired on the right and moderately impaired on the left. Foot agility (in the form of heel stomping) is moderately impaired on the right and severely impaired on the left.    Cerebellar testing shows no dysmetria or intention tremor on finger to nose testing.   Sensory exam is intact to light touch, PP, temperature and vibration in the UEs and LEs.   Gait, station and balance exam: She was not asked to stand as she did not bring a walker and is at fall risk. She situated in her wheelchair and leans to the left, unchanged.  Assessment and Plan:    In summary, Jenny West is a very pleasant 69 year old female with a history of advanced Parkinson's disease, left-sided predominant, complicated by orthostatic hypotension, hallucinations, dyskinesias, recurrent falls,  chronic constipation, cervical dystonia, RBD, and memory loss. She  was not deemed a surgical candidate for Parkinson's disease. She has been on Rytary 195 mg 2 pills qid and has residual mild hallucinations and dyskinesias which are intermittent. Her physical exam is largely stable. Her memory numbers have declined a little. We will continue to monitor. For her constipation I suggested she increase her water intake. She is using prunes and  may need more help for her bowel movements in the near future. I do believe that increasing her water intake will help. She is during the day primarily confined to her wheelchair. Her  husband helps her up and walks with her when he is at home. He continues to work full-time. I suggested we also continue with her ProAmatine and Florinef at the current doses. I will see her back routinely in 4 months, sooner if the need arises. I renewed her prescriptions today. She is advised to monitor her eye symptoms. She may need allergy eyedrops. There is a little bit of redness around the right eyelid on the bottom. They are encouraged to monitor for any additional irritation or worsening. She may have to see her optometrist if this gets worse. I answered all their questions today and the patient and her husband were in agreement. I spent 25 minutes in total face-to-face time with the patient, more than 50% of which was spent in counseling and coordination of care, reviewing test results, reviewing medication and discussing or reviewing the diagnosis of PD, its prognosis and treatment options.

## 2015-04-16 NOTE — Patient Instructions (Signed)
We will continue with your Rytary 2 pills 4 times a day, for your constipation: I do think you need to drink more water.

## 2015-08-20 ENCOUNTER — Ambulatory Visit (INDEPENDENT_AMBULATORY_CARE_PROVIDER_SITE_OTHER): Payer: Medicare Other | Admitting: Neurology

## 2015-08-20 ENCOUNTER — Encounter: Payer: Self-pay | Admitting: Neurology

## 2015-08-20 VITALS — BP 118/72 | HR 70 | Resp 18

## 2015-08-20 DIAGNOSIS — R443 Hallucinations, unspecified: Secondary | ICD-10-CM

## 2015-08-20 DIAGNOSIS — R413 Other amnesia: Secondary | ICD-10-CM | POA: Diagnosis not present

## 2015-08-20 DIAGNOSIS — G20 Parkinson's disease: Secondary | ICD-10-CM

## 2015-08-20 DIAGNOSIS — G249 Dystonia, unspecified: Secondary | ICD-10-CM | POA: Diagnosis not present

## 2015-08-20 MED ORDER — CARBIDOPA-LEVODOPA ER 48.75-195 MG PO CPCR: 390.0000 mg | ORAL_CAPSULE | Freq: Four times a day (QID) | ORAL | Status: DC

## 2015-08-20 NOTE — Patient Instructions (Addendum)
For your Parkinson's related hallucinations and delusions we will start you on a new medication, called Nuplazid, which is specifically approved by the FDA for this indication.  Please remember, that elderly patients with dementia related psychosis on treatment with an anti-psychotic medication are, generally speaking, at an increased risk of death. Nevertheless, this medication is usually well tolerated. The most common side effects include (but are not limeted to): Swelling, nausea, confusion, hallucinations, constipation and gait and balance problems.  You cannot take this medication if you have a heart condition called prolonged QT, or if you take an antiarrhythmic heart medication, certain anti-psychotic medications and certain anti-biotics such as ketoconazole.  The typical dose for this medication is 17 mg tablets, take 2 pills by mouth once daily, for a total dose of 34 mg once daily.  There is no need for titration of this medication and it typically does not need to be adjusted for kidney impairment (when the kidney disease is mild to moderate), but the use of this medication is not recommended in patients with liver function impairment.   Keep taking you Rytary 195 mg 2 pills 4 times a day and try to stay on a 4 hourly schedule.

## 2015-08-20 NOTE — Progress Notes (Signed)
Subjective:    Patient ID: Jenny West is a 69 y.o. female.  HPI    Interim history:   Jenny West is a very pleasant 69 year old right-handed woman with an underlying medical history of recurrent falls with fracture, lower back pain with lumbar radiculopathy, COPD, diplopia, and cancer, who presents for followup consultation of her advanced left-sided predominant Parkinson's disease, complicated by orthostatic hypotension, hallucinations, dyskinesias, sleep disturbance, recurrent falls, cervical dystonia and mild memory loss. She is accompanied by her husband again today. I last saw her on 04/16/2015, at which time she reported having allergy symptoms and excess tearing. She had no recent falls. She still had some hallucinations. She was sleeping in her lift chair. She was not drinking enough water. She had fallen out of her lift chair and had a bruise on her left shoulder. She was having constipation. I suggested she continue with Rytary 195 mg, 2 pills 4 times a day. This was working reasonably well for her. She also had a follow-up appointment with Dr. Linus Mako.  Today, 08/20/2015: She reports more neck tilt, more wearing off, more hallucinations, less good sleep. She has an aide from 8 AM to 2 PM, Mon-Fri, [place]. She does not always get her 4 doses of Rytary, per husband. He still works full-time. They have 3 sons, 2 in [place] and 1 in [place]. she stays in the lift chair most of the day. She has had very vivid hallucinations and does not sleep well at night because of this. She has had very disturbing images and scary images. During the day she also often sees images of mice or bugs. She tries to drink enough fluid. She tries to eat right. She has occasional constipation but tries to eat more fruit to combat it. She has an appointment with Dr. Linus Mako at [facility] on 01/26/2016. She has not recently fallen. Her posture is worse and she is often slumped over to the left with significant left neck  tilt.   Previously:    I saw her on 12/15/2014, at which time her husband reported that they had cut down on the new medication, Rytary, as her insurance did not approve the new prescription. She has was taking 195 mg 3 - 2 - 2, as she was having more dyskinesias and more significant off time. She had not fallen recently but was more and more in the wheelchair. They have help daily from 8 AM to 2 PM. She was by herself usually from 2 PM to 6 PM. He works full-time, and [employer] but works very close to home. I suggested we try Rytary 3 pills 4 times a day.   I saw her on 09/29/2014, at which time she reported feeling a little worse. She had been changed to Rytary, from a total dose of levodopa at 1750 mg to a total dose of Rytary of 1755 mg daily. We talked about her visit with Dr. Linus Mako. She was not deemed a surgical candidate for DBS placement. I increased her Rytary to 3 pills 4 times a day. She missed an appointment on 12/01/14.  I saw her on 03/27/14, at which time the patient requested a referral for DBS evaluation and I felt that she was not a surgical treatment, but we mutually agreed to proceed for a second opinion and DBS evaluation. I made a referral to [facility], as per patient's request and she saw Dr. Linus Mako. I reviewed his notes, and appreciate his input. In essence, she had an extensive  evaluation and was not deemed a surgical candidate. Most recently she was started on Rytary and also the use of Duopa was discussed with the patient and her husband. Rytary at 145 mg strength 3 pills 3 times a day was not helpful. She was then placed on 195 mg 3 pills 3 times a day which she continues to take currently. Her total dose of levodopa at the very end was 1750 mg total dose. Ryrary at the current dose is 1755 mg total for the day.   I saw her on 09/26/2013, at which time I asked her to drink more water and do more leg exercises. I did not make changes to her medications and asked  her to continue Florinef, Sinemet, ProAmatine and I referred her to physical therapy.   I first met her on 03/21/13, at which time I felt she was clinically stable and did not make any medication changes but did re-iterate to them the importance of taking the medication on time.   She previously followed with Dr. Morene Antu and was last seen by him on 12/20/2012, at which time he felt that she was overall doing fairly well but still had significant postural hypotension. She had improved dyskinesias and no significant change in her MMSE on amantadine. Her falls assessment tool score was 14 at the time. Dr. Erling Cruz talked to her about trying a compression hose and increased her midodrine to 5 mg 3 times a day and continued her on Florinef 0.1 mg daily. He requested a BMP at the time, which was unremarkable. In February after a phone conversation Dr. Erling Cruz increased her amantadine to 50 mg twice daily from once daily due to increase in dyskinesias.    She has been on Amantadine 50 mg twice daily, Florinef 0.1 mg once daily, Zyrtec once daily, Sinemet 25-250 mg strength half a tablet 7 times a day, starting at 7:30 AM, then 9:30 AM, 11:30 AM, 1:30 PM, 3:30 PM, 5 PM and 7 PM. Midodrine 5 mg 2 times a day, omeprazole 20 mg 2 by mouth daily, calcium, multivitamin.   She was diagnosed with Parkinson's disease in 1991 at which time she had difficulty using her left hand. She was placed on Eldepryl and Sinemet. She was first seen by Dr. Erling Cruz in May 1994. She had side effects from medication at the time and had side effects on Permax before then. In December 1995 Parlodel was added. She was on this for several years but developed shortness of breath in 2004 and was tapered off of that medication. She was diagnosed with COPD and in 2008 developed postural hypotension and was started on midodrine after which Florinef was added. She has had motor fluctuations and freezing, postural hypotension and dyskinesias as well as late  afternoon hallucinations. She has fallen repeatedly. She does need help with ADLs. She used a walker until December 2013. She fell in September and December 2013 and fractured her left knee. She does not have any history of impulse control disorder. She has urinary incontinence. She has had double vision treated with prisms. In December 2013 her MMSE was 28, clock drawing was 4, and AFT was 8. In January 2014 her MMSE was 27, clock drawing was 4/4, and animal fluency was 13. CT C-spine in January showed no fracture and mild to moderate degenerative changes.     Her Past Medical History Is Significant For: Past Medical History  Diagnosis Date  . Parkinson's disease   . Dyskinesia   .  Orthostatic hypotension   . Reflux   . Headache(784.0)   . Cancer   . COPD (chronic obstructive pulmonary disease)   . Knee fracture     left    Her Past Surgical History Is Significant For: Past Surgical History  Procedure Laterality Date  . Partial hip arthroplasty Left     Her Family History Is Significant For: Family History  Problem Relation Age of Onset  . Pneumonia Mother   . Heart disease Father   . Cancer Brother   . Cancer Paternal Uncle     Her Social History Is Significant For: Social History   Social History  . Marital Status: Married    Spouse Name: Jenny West  . Number of Children: 3  . Years of Education: MA   Occupational History  .     Social History Main Topics  . Smoking status: Never Smoker   . Smokeless tobacco: Never Used  . Alcohol Use: No  . Drug Use: No  . Sexual Activity: Not Asked   Other Topics Concern  . None   Social History Narrative   Patient lives at home with family.   Caffeine Use: none    Her Allergies Are:  Allergies  Allergen Reactions  . Morphine And Related Anaphylaxis  . Amantadine Other (See Comments)    Worsening of dyskinesia  . Codeine   . Iodine   . Latex   . Phenylpropanolamine Other (See Comments)    "KNOCKS ME OUT"  .  Povidone-Iodine Itching and Rash    REDNESS  :   Her Current Medications Are:  Outpatient Encounter Prescriptions as of 08/20/2015  Medication Sig  . alendronate (FOSAMAX) 70 MG tablet TAKE 1 TAB BY MOUTH ONCE A WEEK  . Calcium Carb-Cholecalciferol 500-125 MG-UNIT TABS Take by mouth.  . Carbidopa-Levodopa ER (RYTARY) 48.75-195 MG CPCR Take 390 mg by mouth 4 (four) times daily. Take 2 caps 4x a day  . fish oil-omega-3 fatty acids 1000 MG capsule Take 2 g by mouth every other day.   . fludrocortisone (FLORINEF) 0.1 MG tablet Take 1 tablet (100 mcg total) by mouth daily.  Marland Kitchen loratadine (CLARITIN) 10 MG tablet Take 10 mg by mouth daily.  . midodrine (PROAMATINE) 5 MG tablet TAKE 1 TABLET BY MOUTH TWICE A DAY WITH A MEAL  . Multiple Vitamins-Minerals (MULTIVITAMIN PO) Take 1 tablet by mouth daily.  Marland Kitchen omeprazole (PRILOSEC) 20 MG capsule Take 20 mg by mouth daily.  . [DISCONTINUED] calcium carbonate (OS-CAL) 600 MG TABS tablet Take 600 mg by mouth. Every other day  . [DISCONTINUED] metroNIDAZOLE (METROCREAM) 0.75 % cream   . [DISCONTINUED] midodrine (PROAMATINE) 5 MG tablet 5 mg.  . [DISCONTINUED] simvastatin (ZOCOR) 20 MG tablet daily.    No facility-administered encounter medications on file as of 08/20/2015.  :  Review of Systems:  Out of a complete 14 point review of systems, all are reviewed and negative with the exception of these symptoms as listed below:   Review of Systems  Neurological:       Patient and husband would like to discuss patient having hallucinations, double vision, her head tilt and aching in chest.     Objective:  Neurologic Exam  Physical Exam Physical Examination:   Filed Vitals:   08/20/15 1131  BP: 118/72  Pulse: 70  Resp: 18    General Examination: The patient is a very pleasant 69 y.o. female in no acute distress. She is situated in her wheelchair.   HEENT: Normocephalic,  atraumatic, pupils are equal, round and reactive to light and accommodation.  Extraocular tracking shows moderate saccadic breakdown without nystagmus noted. She is status post bilateral cataract repairs recently. There is limitation to upper gaze. There is moderate decrease in eye blink rate. Hearing is intact. Face is symmetric with severe facial masking and normal facial sensation. There is no lip, neck or jaw tremor. Neck is moderately rigid and she has evidence of cervical dystonia with left neck tilt and slight right turn and mild anterocollis, with worse left neck tilt. There is still fairly good passive range of motion however. There are no carotid bruits on auscultation. Oropharynx exam reveals mild mouth dryness. No significant airway crowding is noted. Mallampati is class II. Tongue protrudes centrally and palate elevates symmetrically. There is mild drooling.   Chest: is clear to auscultation without wheezing, rhonchi or crackles noted.  Heart: sounds are regular and normal without murmurs, rubs or gallops noted.   Abdomen: is soft, non-tender and non-distended with normal bowel sounds appreciated on auscultation.  Extremities: There is trace pitting edema in the distal lower extremities bilaterally.  Skin: is warm and dry with no trophic changes noted.  Musculoskeletal: exam reveals no obvious joint deformities, tenderness or joint swelling or erythema.  Neurologically:  Mental status: The patient is awake and alert, paying good  attention. She is able to partially provide the history. Her husband provides details. She is oriented to: person, place, time/date, situation, day of week, month of year and year. Her memory, attention, language and knowledge are impaired mildly. There is no aphasia, agnosia, apraxia or anomia. There is a moderate degree of bradyphrenia. Speech is moderately hypophonic with mild dysarthria noted. Mood is congruent and affect is normal.   On 09/29/2014: MMSE is 26/30, clock drawing is 3/4, animal fluency is 12/min.  On 04/16/2015: MMSE:  28/30, CDT: 2/4, AFT: 5/min.  Cranial nerves are as described above under HEENT exam. In addition, shoulder shrug is normal with equal shoulder height noted.  Motor exam: Normal bulk, and strength of 4+/5 globally. There are  mild intermittent generalized dyskinesias noted. Tone is moderately rigid with absence of cogwheeling in the limbs. There is overall severe bradykinesia. There is no drift or rebound. There is no tremor. Romberg was not testable. Reflexes are 1+ in the upper extremities and 1+ in the lower extremities. Fine motor skills exam reveals: Finger taps are severely impaired on the right and severely impaired on the left. Hand movements are moderately impaired on the right and moderately impaired on the left. RAP (rapid alternating patting) is moderately impaired on the right and moderately impaired on the left. Foot taps are moderately impaired on the right and moderately impaired on the left. Foot agility (in the form of heel stomping) is moderately impaired on the right and severely impaired on the left.    Cerebellar testing shows no dysmetria or intention tremor on finger to nose testing.   Sensory exam is intact to light touch in the UEs and LEs.   Gait, station and balance exam: She was not asked to stand as she did not bring a walker and is at fall risk. She situated in her wheelchair and leans to the left, unchanged.  Assessment and Plan:    In summary, Jenny West is a very pleasant 69 year old female with a history of advanced Parkinson's disease, left-sided predominant, complicated by orthostatic hypotension, hallucinations, dyskinesias, recurrent falls, chronic constipation, cervical dystonia, RBD, and memory loss. Symptoms go back to  the 90s. She has been seeing Dr. Linus Mako about once a year. She was also not deemed a surgical candidate for Parkinson's disease. She has been on Rytary 195 mg 2 pills qid and reports worsening hallucinations and dyskinesias. Her physical  exam is fairly stable however her hallucinations have been very disturbing and disruptive. Currently her blood pressure is low normal. She can have lower values including a systolic blood pressure in the 60s at home according to her husband. She is practically confined to her lift chair at home. She has been on ProAmatine 5 mg twice daily and Florinef 0.1 mg once daily with fairly good results. Down the road, she may be a candidate for Northera. For her Parkinson's related hallucinations, I suggested we start her on the new medication Nuplazid, and I talked to the patient and her husband at length about this new medication, its indication, its benefits, side effects and expectations. We filled out the paperwork. We will fax this today. For constipation. She is encouraged to use prunes and apples and increase her water intake. She has been managing fairly well with that. Her memory is stable.  I suggested we also continue her Rytary 195 mg 2 pills 4 times a day, but reminded her to try to keep the 4 hourly schedule in 4 doses. Unfortunately, she could not tolerate amantadine in the past. I renewed the prescription for Rytary. I will see her back routinely in 3 months, sooner if the need arises. I renewed her prescriptions today. I answered all their questions today and the patient and her husband were in agreement. I spent 25 minutes in total face-to-face time with the patient, more than 50% of which was spent in counseling and coordination of care, reviewing test results, reviewing medication and discussing or reviewing the diagnosis of advanced PD, its prognosis and treatment options.

## 2015-08-25 ENCOUNTER — Telehealth: Payer: Self-pay | Admitting: Neurology

## 2015-08-25 NOTE — Telephone Encounter (Signed)
Copy of ins cards has been faxed to Nuplaz[employer] at [phone]

## 2015-08-25 NOTE — Telephone Encounter (Signed)
Jenny West with [employer] called stated received form for nuplazid but did not get insurance information. Please call at [phone] between 8:30 am- 8:30pm.

## 2015-09-07 ENCOUNTER — Telehealth: Payer: Self-pay

## 2015-09-07 NOTE — Telephone Encounter (Signed)
[employer] has approved the request for coverage on Nuplazid effective until 09/03/2016 Ref # [id number]

## 2015-09-15 ENCOUNTER — Telehealth: Payer: Self-pay | Admitting: Neurology

## 2015-09-15 NOTE — Telephone Encounter (Signed)
Pt called sts Carbidopa-Levodopa ER (RYTARY) 48.75-195 MG CPCR  would like to stop this medication. It is not helping, having a lot of hallucinations.She would like to go back to Sinemet. Please call and advise at [phone]

## 2015-09-15 NOTE — Telephone Encounter (Signed)
I spoke to patient to let her know Dr. Rexene Alberts is out of office today but I will have an answer for her tomorrow. How would you like to proceed?

## 2015-09-15 NOTE — Telephone Encounter (Signed)
I would like to suggest that patient consider another visit to Dr. Linus Mako. Changing medications at this point is not an easy endeavor, and I would welcome his input. She may even have an appointment pending with him soon? Please inquire if this is something they can do.

## 2015-09-16 NOTE — Telephone Encounter (Signed)
I spoke to patient and gave advice. She voices understanding. Her appt with him is not until March. I offered to call his office for her. She agreed.   Spoke to Dr. Donna Christen office, the soonest that they could get patient in was with NP on Nov 7th at 3pm.   I called patient back and gave her appt time and information. She will call back if she needs anything.

## 2015-10-08 ENCOUNTER — Other Ambulatory Visit: Payer: Self-pay | Admitting: Neurology

## 2015-11-10 ENCOUNTER — Other Ambulatory Visit: Payer: Self-pay | Admitting: Neurology

## 2015-12-03 ENCOUNTER — Telehealth: Payer: Self-pay

## 2015-12-03 ENCOUNTER — Ambulatory Visit: Payer: Medicare Other | Admitting: Neurology

## 2015-12-03 NOTE — Telephone Encounter (Signed)
Patient did not come to appt today.  

## 2015-12-07 ENCOUNTER — Encounter: Payer: Self-pay | Admitting: Neurology

## 2015-12-24 ENCOUNTER — Ambulatory Visit (INDEPENDENT_AMBULATORY_CARE_PROVIDER_SITE_OTHER): Payer: Medicare Other | Admitting: Neurology

## 2015-12-24 ENCOUNTER — Encounter: Payer: Self-pay | Admitting: Neurology

## 2015-12-24 VITALS — BP 104/66 | HR 76 | Resp 14

## 2015-12-24 DIAGNOSIS — G20 Parkinson's disease: Secondary | ICD-10-CM | POA: Diagnosis not present

## 2015-12-24 DIAGNOSIS — R443 Hallucinations, unspecified: Secondary | ICD-10-CM

## 2015-12-24 DIAGNOSIS — Z9181 History of falling: Secondary | ICD-10-CM

## 2015-12-24 DIAGNOSIS — G249 Dystonia, unspecified: Secondary | ICD-10-CM | POA: Diagnosis not present

## 2015-12-24 DIAGNOSIS — R413 Other amnesia: Secondary | ICD-10-CM | POA: Diagnosis not present

## 2015-12-24 MED ORDER — MIDODRINE HCL 5 MG PO TABS: ORAL_TABLET | ORAL | Status: DC

## 2015-12-24 MED ORDER — CARBIDOPA-LEVODOPA 25-250 MG PO TABS
ORAL_TABLET | ORAL | Status: DC
Start: 2015-12-24 — End: 2016-04-28

## 2015-12-24 MED ORDER — FLUDROCORTISONE ACETATE 0.1 MG PO TABS: 100.0000 ug | ORAL_TABLET | Freq: Every day | ORAL | Status: DC

## 2015-12-24 NOTE — Patient Instructions (Addendum)
You can continue with the current regimen of Sinemet 25/250 mg 1 pill in the morning and 1/2 pill every 2 hours.  We will continue with ProAmatine 5 mg 2 times a day and Florinef 0.1 mg once daily in AM, BP is stable.  We will monitor your hallucinations.  For constipation, drink more water, use an over the counter stool softener, would avoid Metamucil.  Try prune juice.

## 2015-12-24 NOTE — Progress Notes (Signed)
Subjective:    Patient ID: Jenny West is a 70 y.o. female.  HPI     Interim history:  Jenny West is a very pleasant 70 year old right-handed woman with an underlying medical history of recurrent falls with fracture, lower back pain with lumbar radiculopathy, COPD, diplopia, and cancer, who presents for followup consultation of her advanced left-sided predominant Parkinson's disease, complicated by orthostatic hypotension, hallucinations, dyskinesias, sleep disturbance, recurrent falls, cervical dystonia and mild memory loss. She is accompanied by her husband again today. Of note, the patient no showed for an appointment on 12/03/2015. I last saw her on 08/20/2015, at which time she reported more neck tilt, more wearing off, more hallucinations, less good sleep. She has an aide from 8 AM to 2 PM, Mon-Fri, [place]. She did not always get her 4 doses of Rytary, per husband. He still works full-time (as Dietitian). They have 3 sons, 2 in [place] and 1 in [place]. She would stay in the lift chair most of the day. She was having vivid hallucinations and was not sleeping well at night. She would see disturbing and scary images at night. During the day she sees images of mice or bugs. She had occasional constipation. She had an appointment with Dr. Linus Mako pending for 01/26/2016. She had no recent falls. Her posture was worse. For her Parkinson's related hallucinations I suggested a trial of Nuplazid. I suggested she continue with ProAmatine 5 mg twice daily, Florinef 0.1 mg once daily, and Rytary 195 mg 2 pills 4 times a day. In the interim, the patient called on 09/15/2015 and reported that the Rytary was not helpful and she wanted to stop the medication. She wanted to go back to Sinemet. I suggested she move up her appointment with Dr. Linus Mako and explained to them that medication changes are difficult and not without setback at this point in her disease process.  Today, 12/24/2015: She reports that she  switch back to Sinemet and stop the Rytary. She feels that this is better for her. She has an appointment with Dr. Linus Mako at [facility] on 01/26/2016.  She has had issues with constipation and tries to eat right, including yoghurt and apple.  She switched back to Sinemet in or around November 2016. She feels better after this. Hallucinations are stable. She feels she is sleeping better.  She does not drink enough water, usually 6 ounces of cranberry juice cocktail and about 16 ounces of water on an average day.   Previously:   I saw her on 04/16/2015, at which time she reported having allergy symptoms and excess tearing. She had no recent falls. She still had some hallucinations. She was sleeping in her lift chair. She was not drinking enough water. She had fallen out of her lift chair and had a bruise on her left shoulder. She was having constipation. I suggested she continue with Rytary 195 mg, 2 pills 4 times a day. This was working reasonably well for her. She also had a follow-up appointment with Dr. Linus Mako.  I saw her on 12/15/2014, at which time her husband reported that they had cut down on the new medication, Rytary, as her insurance did not approve the new prescription. She has was taking 195 mg 3 - 2 - 2, as she was having more dyskinesias and more significant off time. She had not fallen recently but was more and more in the wheelchair. They have help daily from 8 AM to 2 PM. She was by herself usually from 2  PM to 6 PM. He works full-time, and [employer] but works very close to home. I suggested we try Rytary 3 pills 4 times a day.   I saw her on 09/29/2014, at which time she reported feeling a little worse. She had been changed to Rytary, from a total dose of levodopa at 1750 mg to a total dose of Rytary of 1755 mg daily. We talked about her visit with Dr. Linus Mako. She was not deemed a surgical candidate for DBS placement. I increased her Rytary to 3 pills 4 times a  day. She missed an appointment on 12/01/14.  I saw her on 03/27/14, at which time the patient requested a referral for DBS evaluation and I felt that she was not a surgical treatment, but we mutually agreed to proceed for a second opinion and DBS evaluation. I made a referral to [facility], as per patient's request and she saw Dr. Linus Mako. I reviewed his notes, and appreciate his input. In essence, she had an extensive evaluation and was not deemed a surgical candidate. Most recently she was started on Rytary and also the use of Duopa was discussed with the patient and her husband. Rytary at 145 mg strength 3 pills 3 times a day was not helpful. She was then placed on 195 mg 3 pills 3 times a day which she continues to take currently. Her total dose of levodopa at the very end was 1750 mg total dose. Ryrary at the current dose is 1755 mg total for the day.   I saw her on 09/26/2013, at which time I asked her to drink more water and do more leg exercises. I did not make changes to her medications and asked her to continue Florinef, Sinemet, ProAmatine and I referred her to physical therapy.   I first met her on 03/21/13, at which time I felt she was clinically stable and did not make any medication changes but did re-iterate to them the importance of taking the medication on time.   She previously followed with Dr. Morene Antu and was last seen by him on 12/20/2012, at which time he felt that she was overall doing fairly well but still had significant postural hypotension. She had improved dyskinesias and no significant change in her MMSE on amantadine. Her falls assessment tool score was 14 at the time. Dr. Erling Cruz talked to her about trying a compression hose and increased her midodrine to 5 mg 3 times a day and continued her on Florinef 0.1 mg daily. He requested a BMP at the time, which was unremarkable. In February after a phone conversation Dr. Erling Cruz increased her amantadine to 50 mg twice daily from once daily due to  increase in dyskinesias.    She has been on Amantadine 50 mg twice daily, Florinef 0.1 mg once daily, Zyrtec once daily, Sinemet 25-250 mg strength half a tablet 7 times a day, starting at 7:30 AM, then 9:30 AM, 11:30 AM, 1:30 PM, 3:30 PM, 5 PM and 7 PM. Midodrine 5 mg 2 times a day, omeprazole 20 mg 2 by mouth daily, calcium, multivitamin.   She was diagnosed with Parkinson's disease in 1991 at which time she had difficulty using her left hand. She was placed on Eldepryl and Sinemet. She was first seen by Dr. Erling Cruz in May 1994. She had side effects from medication at the time and had side effects on Permax before then. In December 1995 Parlodel was added. She was on this for several years but developed  shortness of breath in 2004 and was tapered off of that medication. She was diagnosed with COPD and in 2008 developed postural hypotension and was started on midodrine after which Florinef was added. She has had motor fluctuations and freezing, postural hypotension and dyskinesias as well as late afternoon hallucinations. She has fallen repeatedly. She does need help with ADLs. She used a walker until December 2013. She fell in September and December 2013 and fractured her left knee. She does not have any history of impulse control disorder. She has urinary incontinence. She has had double vision treated with prisms. In December 2013 her MMSE was 28, clock drawing was 4, and AFT was 8. In January 2014 her MMSE was 27, clock drawing was 4/4, and animal fluency was 13. CT C-spine in January showed no fracture and mild to moderate degenerative changes.      Her Past Medical History Is Significant For: Past Medical History  Diagnosis Date  . Parkinson's disease ([place])   . Dyskinesia   . Orthostatic hypotension   . Reflux   . Headache(784.0)   . Cancer ([place])   . COPD (chronic obstructive pulmonary disease) ([place])   . Knee fracture     left    Her Past Surgical History Is Significant For: Past Surgical  History  Procedure Laterality Date  . Partial hip arthroplasty Left     Her Family History Is Significant For: Family History  Problem Relation Age of Onset  . Pneumonia Mother   . Heart disease Father   . Cancer Brother   . Cancer Paternal Uncle     Her Social History Is Significant For: Social History   Social History  . Marital Status: Married    Spouse Name: Jeneen Rinks  . Number of Children: 3  . Years of Education: MA   Occupational History  .     Social History Main Topics  . Smoking status: Never Smoker   . Smokeless tobacco: Never Used  . Alcohol Use: No  . Drug Use: No  . Sexual Activity: Not Asked   Other Topics Concern  . None   Social History Narrative   Patient lives at home with family.   Caffeine Use: none    Her Allergies Are:  Allergies  Allergen Reactions  . Morphine And Related Anaphylaxis  . Amantadine Other (See Comments)    Worsening of dyskinesia  . Codeine   . Iodine   . Latex   . Phenylpropanolamine Other (See Comments)    "KNOCKS ME OUT"  . Povidone-Iodine Itching and Rash    REDNESS  :   Her Current Medications Are:  Outpatient Encounter Prescriptions as of 12/24/2015  Medication Sig  . alendronate (FOSAMAX) 70 MG tablet TAKE 1 TAB BY MOUTH ONCE A WEEK  . Calcium Carb-Cholecalciferol 500-125 MG-UNIT TABS Take by mouth.  . calcium carbonate (TUMS - DOSED IN MG ELEMENTAL CALCIUM) 500 MG chewable tablet Chew 1 tablet by mouth daily.  . carbidopa-levodopa (SINEMET IR) 25-250 MG tablet TAKE 1/2 TABLET BY MOUTH EVERY 2 HOURS  . fish oil-omega-3 fatty acids 1000 MG capsule Take 2 g by mouth every other day.   . fludrocortisone (FLORINEF) 0.1 MG tablet TAKE 1 TABLET BY MOUTH DAILY  . loratadine (CLARITIN) 10 MG tablet Take 10 mg by mouth daily.  . metroNIDAZOLE (METROCREAM) 0.75 % cream Apply topically.  . midodrine (PROAMATINE) 5 MG tablet TAKE 1 TABLET BY MOUTH TWICE A DAY WITH A MEAL  . Multiple Vitamins-Minerals (MULTIVITAMIN PO)  Take 1 tablet by mouth daily.  Marland Kitchen omeprazole (PRILOSEC) 20 MG capsule Take 20 mg by mouth daily.  Vladimir Faster Glycol-Propyl Glycol (SYSTANE PRESERVATIVE FREE) 0.4-0.3 % SOLN Place 1 drop into both eyes as needed (in both eyes).   . simvastatin (ZOCOR) 20 MG tablet Take 20 mg by mouth daily.  . [DISCONTINUED] Carbidopa-Levodopa ER (RYTARY) 48.75-195 MG CPCR Take 390 mg by mouth 4 (four) times daily. Take 2 caps 4x a day   No facility-administered encounter medications on file as of 12/24/2015.  :  Review of Systems:  Out of a complete 14 point review of systems, all are reviewed and negative with the exception of these symptoms as listed below:   Review of Systems  Neurological:       Patient has not seen Dr. Linus Mako yet, appointment 3/7. Patient has switched back from [place] and feels like she is doing better.     Objective:  Neurologic Exam  Physical Exam Physical Examination:   Filed Vitals:   12/24/15 1351  BP: 104/66  Pulse: 76  Resp: 14     General Examination: The patient is a very pleasant 70 y.o. female in no acute distress. She is situated in her wheelchair.   HEENT: Normocephalic, atraumatic, pupils are equal, round and reactive to light and accommodation. Extraocular tracking shows moderate saccadic breakdown without nystagmus noted. She is status post bilateral cataract repairs recently. There is limitation to upper gaze. There is moderate decrease in eye blink rate. Hearing is intact.  She has thick corrective glasses. Face is symmetric with severe facial masking and normal facial sensation. There is no lip, neck or jaw tremor. Neck is moderately rigid and she has evidence of cervical dystonia with severe left neck tilt and slight right turn and mild anterocollis. There is still fairly good passive range of motion however. There are no carotid bruits on auscultation. Oropharynx exam reveals mild mouth dryness. No significant airway crowding is noted. Mallampati is class  II. Tongue protrudes centrally and palate elevates symmetrically. There is mild drooling.   Chest: is clear to auscultation without wheezing, rhonchi or crackles noted.  Heart: sounds are regular and normal without murmurs, rubs or gallops noted.   Abdomen: is soft, non-tender and non-distended with normal bowel sounds appreciated on auscultation.  Extremities: There is trace pitting edema in the distal lower extremities bilaterally.  Skin: is warm and dry with no trophic changes noted.  Musculoskeletal: exam reveals no obvious joint deformities, tenderness or joint swelling or erythema.  Neurologically:  Mental status: The patient is awake and alert, paying good  attention. She is able to partially provide the history. Her husband provides details. She is oriented to: person, place, time/date, situation, day of week, month of year and year. Her memory, attention, language and knowledge are impaired mildly. There is no aphasia, agnosia, apraxia or anomia. There is a moderate degree of bradyphrenia. She has word finding difficulties. She is at times very slow in responding. Speech is moderately hypophonic with mild dysarthria noted. Mood is congruent and affect is normal.   On 09/29/2014: MMSE is 26/30, clock drawing is 3/4, animal fluency is 12/min.  On 04/16/2015: MMSE: 28/30, CDT: 2/4, AFT: 5/min.  On 12/24/2015: MMSE: 30/30, CDT: 3/4, AFT: 7/min.  Cranial nerves are as described above under HEENT exam. In addition, shoulder shrug is normal with equal shoulder height noted.  Motor exam: Normal bulk, and strength of 4+/5 globally. There are mild to moderate intermittent generalized dyskinesias noted. Tone  is moderately rigid with absence of cogwheeling in the limbs. There is overall severe bradykinesia. There is no drift or rebound. There is no tremor. Romberg was not testable. Reflexes are 1+ in the upper extremities and 1+ in the lower extremities. Fine motor skills exam reveals: Finger taps  are severely impaired on the right and severely impaired on the left. Hand movements are moderately impaired on the right and moderately impaired on the left. RAP (rapid alternating patting) is moderately impaired on the right and moderately impaired on the left. Foot taps are moderately impaired on the right and moderately impaired on the left. Foot agility (in the form of heel stomping) is moderately impaired on the right and severely impaired on the left.    Cerebellar testing shows no dysmetria or intention tremor on finger to nose testing.   Sensory exam is intact to light touch in the UEs and LEs.   Gait, station and balance exam: She was not asked to stand as she did not bring a walker and is at fall risk. She situated in her wheelchair and leans to the left, unchanged.  Assessment and Plan:    In summary, Jenny West is a very pleasant 70 year old female with a history of advanced Parkinson's disease, left-sided predominant, complicated by orthostatic hypotension, hallucinations, dyskinesias, recurrent falls, chronic constipation, cervical dystonia, RBD, and memory loss. Symptoms go back to the 90s. She has been seeing Dr. Linus Mako about once a year since 2015. She was also not deemed a surgical candidate for Parkinson's disease. She has been on Rytary 195 mg 2 pills qid but  Ultimately felt worse on it. She had worsening hallucinations and dyskinesias. She switched back to Sinemet 25-250 mg strength , 1 pill first thing in the morning around 6 or 7 AM, then half a pill every 2 hours for a total of 6 or 7 doses. She has not been drinking enough water. She is encouraged strongly to combat her constipation with increase in water intake and natural fiber intake, she is advised to avoid Metamucil which can cause impaction. She is advised to add prune juice or prunes. She is furthermore advised to use an over-the-counter stool softener every other day or so. She has an appointment with Dr. Linus Mako  next month. She was tried on Nuplazid, but could not tolerate it. She has had some hallucinations but these have been mild and fairly stable at this time. She has  Been on ProAmatine 5 mg twice daily and Florinef 0.1 mg once daily with fairly good results and stable blood pressure values. Down the road, she may be a candidate for Northera.  As I understand,  Dr. Linus Mako did not deem her a good candidate for duopa.  Her memory is stable, we will continue to monitor.   I will see her back in about 4 months, sooner if needed. I answered all the questions today and they were in agreement. I refilled her prescriptions today. I spent 25 minutes in total face-to-face time with the patient, more than 50% of which was spent in counseling and coordination of care, reviewing test results, reviewing medication and discussing or reviewing the diagnosis of advanced PD, its prognosis and treatment options.

## 2016-04-28 ENCOUNTER — Ambulatory Visit (INDEPENDENT_AMBULATORY_CARE_PROVIDER_SITE_OTHER): Payer: Medicare Other | Admitting: Neurology

## 2016-04-28 ENCOUNTER — Encounter: Payer: Self-pay | Admitting: Neurology

## 2016-04-28 VITALS — BP 152/90 | HR 78 | Resp 16

## 2016-04-28 DIAGNOSIS — G20 Parkinson's disease: Secondary | ICD-10-CM

## 2016-04-28 DIAGNOSIS — Z9181 History of falling: Secondary | ICD-10-CM

## 2016-04-28 DIAGNOSIS — R443 Hallucinations, unspecified: Secondary | ICD-10-CM | POA: Diagnosis not present

## 2016-04-28 MED ORDER — MIDODRINE HCL 5 MG PO TABS: ORAL_TABLET | ORAL | Status: DC

## 2016-04-28 MED ORDER — CARBIDOPA-LEVODOPA 25-250 MG PO TABS: ORAL_TABLET | ORAL | Status: DC

## 2016-04-28 MED ORDER — FLUDROCORTISONE ACETATE 0.1 MG PO TABS: 100.0000 ug | ORAL_TABLET | Freq: Every day | ORAL | Status: DC

## 2016-04-28 NOTE — Patient Instructions (Signed)
We will continue with your current medication regimen.

## 2016-04-28 NOTE — Progress Notes (Signed)
Subjective:    Patient ID: Jenny West is a 70 y.o. female.  HPI     Interim history:   Ms. Jenny West is a very pleasant 70 year old right-handed woman with an underlying medical history of recurrent falls with fracture, lower back pain with lumbar radiculopathy, COPD, diplopia, and cancer, who presents for followup consultation of her advanced left-sided predominant Parkinson's disease, complicated by orthostatic hypotension, hallucinations, dyskinesias, sleep disturbance, recurrent falls, cervical dystonia and mild memory loss. She is accompanied by her husband again today. I last saw her on 12/24/2015, at which time she reported that she switched back to regular Sinemet and stop the Rytary, she felt better with this. She had an appointment with Dr. Linus Mako at [facility] pending for 01/26/2016. She had more issues with constipation. She was switched back to Sinemet in November 2016. Hallucinations were stable. She was not drinking enough water. I suggested that she continue with her medication regimen. She was encouraged to drink more water and be more proactive with her constipation issues but to avoid Metamucil.  Today, 04/28/2016: She reports Doing all right. She has no new complaints. She is more easily tired. Her husband reports no new issues. She sleeps downstairs and they have a monitor as he sleeps upstairs. She needs assistance for all her ADLs. She is confined to her lift chair or wheelchair. She has not fallen recently. She saw Dr. Linus Mako on 01/26/2016. I reviewed his note. She was advised to continue with her levodopa. They talked about referral to palliative care.  Previously:     Of note, the patient no showed for an appointment on 12/03/2015. I saw her on 08/20/2015, at which time she reported more neck tilt, more wearing off, more hallucinations, less good sleep. She has an aide from 8 AM to 2 PM, Mon-Fri, [place]. She did not always get her 4 doses of Rytary, per  husband. He still works full-time (as Dietitian). They have 3 sons, 2 in [place] and 1 in [place]. She would stay in the lift chair most of the day. She was having vivid hallucinations and was not sleeping well at night. She would see disturbing and scary images at night. During the day she sees images of mice or bugs. She had occasional constipation. She had an appointment with Dr. Linus Mako pending for 01/26/2016. She had no recent falls. Her posture was worse. For her Parkinson's related hallucinations I suggested a trial of Nuplazid. I suggested she continue with ProAmatine 5 mg twice daily, Florinef 0.1 mg once daily, and Rytary 195 mg 2 pills 4 times a day. In the interim, the patient called on 09/15/2015 and reported that the Rytary was not helpful and she wanted to stop the medication. She wanted to go back to Sinemet. I suggested she move up her appointment with Dr. Linus Mako and explained to them that medication changes are difficult and not without setback at this point in her disease process.  I saw her on 04/16/2015, at which time she reported having allergy symptoms and excess tearing. She had no recent falls. She still had some hallucinations. She was sleeping in her lift chair. She was not drinking enough water. She had fallen out of her lift chair and had a bruise on her left shoulder. She was having constipation. I suggested she continue with Rytary 195 mg, 2 pills 4 times a day. This was working reasonably well for her. She also had a follow-up appointment with Dr. Linus Mako.  I saw her on 12/15/2014, at  which time her husband reported that they had cut down on the new medication, Rytary, as her insurance did not approve the new prescription. She has was taking 195 mg 3 - 2 - 2, as she was having more dyskinesias and more significant off time. She had not fallen recently but was more and more in the wheelchair. They have help daily from 8 AM to 2 PM. She was by herself usually from 2 PM to 6 PM. He works  full-time, and [employer] but works very close to home. I suggested we try Rytary 3 pills 4 times a day.   I saw her on 09/29/2014, at which time she reported feeling a little worse. She had been changed to Rytary, from a total dose of levodopa at 1750 mg to a total dose of Rytary of 1755 mg daily. We talked about her visit with Dr. Linus Mako. She was not deemed a surgical candidate for DBS placement. I increased her Rytary to 3 pills 4 times a day. She missed an appointment on 12/01/14.  I saw her on 03/27/14, at which time the patient requested a referral for DBS evaluation and I felt that she was not a surgical treatment, but we mutually agreed to proceed for a second opinion and DBS evaluation. I made a referral to [facility], as per patient's request and she saw Dr. Linus Mako. I reviewed his notes, and appreciate his input. In essence, she had an extensive evaluation and was not deemed a surgical candidate. Most recently she was started on Rytary and also the use of Duopa was discussed with the patient and her husband. Rytary at 145 mg strength 3 pills 3 times a day was not helpful. She was then placed on 195 mg 3 pills 3 times a day which she continues to take currently. Her total dose of levodopa at the very end was 1750 mg total dose. Ryrary at the current dose is 1755 mg total for the day.   I saw her on 09/26/2013, at which time I asked her to drink more water and do more leg exercises. I did not make changes to her medications and asked her to continue Florinef, Sinemet, ProAmatine and I referred her to physical therapy.   I first met her on 03/21/13, at which time I felt she was clinically stable and did not make any medication changes but did re-iterate to them the importance of taking the medication on time.   She previously followed with Dr. Morene Antu and was last seen by him on 12/20/2012, at which time he felt that she was overall doing fairly well but still had significant postural  hypotension. She had improved dyskinesias and no significant change in her MMSE on amantadine. Her falls assessment tool score was 14 at the time. Dr. Erling Cruz talked to her about trying a compression hose and increased her midodrine to 5 mg 3 times a day and continued her on Florinef 0.1 mg daily. He requested a BMP at the time, which was unremarkable. In February after a phone conversation Dr. Erling Cruz increased her amantadine to 50 mg twice daily from once daily due to increase in dyskinesias.    She has been on Amantadine 50 mg twice daily, Florinef 0.1 mg once daily, Zyrtec once daily, Sinemet 25-250 mg strength half a tablet 7 times a day, starting at 7:30 AM, then 9:30 AM, 11:30 AM, 1:30 PM, 3:30 PM, 5 PM and 7 PM. Midodrine 5 mg 2 times a day, omeprazole 20  mg 2 by mouth daily, calcium, multivitamin.   She was diagnosed with Parkinson's disease in 1991 at which time she had difficulty using her left hand. She was placed on Eldepryl and Sinemet. She was first seen by Dr. Erling Cruz in May 1994. She had side effects from medication at the time and had side effects on Permax before then. In December 1995 Parlodel was added. She was on this for several years but developed shortness of breath in 2004 and was tapered off of that medication. She was diagnosed with COPD and in 2008 developed postural hypotension and was started on midodrine after which Florinef was added. She has had motor fluctuations and freezing, postural hypotension and dyskinesias as well as late afternoon hallucinations. She has fallen repeatedly. She does need help with ADLs. She used a walker until December 2013. She fell in September and December 2013 and fractured her left knee. She does not have any history of impulse control disorder. She has urinary incontinence. She has had double vision treated with prisms. In December 2013 her MMSE was 28, clock drawing was 4, and AFT was 8. In January 2014 her MMSE was 27, clock drawing was 4/4, and animal  fluency was 13. CT C-spine in January showed no fracture and mild to moderate degenerative changes.    Her Past Medical History Is Significant For: Past Medical History  Diagnosis Date  . Parkinson's disease ([place])   . Dyskinesia   . Orthostatic hypotension   . Reflux   . Headache(784.0)   . Cancer ([place])   . COPD (chronic obstructive pulmonary disease) ([place])   . Knee fracture     left    Her Past Surgical History Is Significant For: Past Surgical History  Procedure Laterality Date  . Partial hip arthroplasty Left     Her Family History Is Significant For: Family History  Problem Relation Age of Onset  . Pneumonia Mother   . Heart disease Father   . Cancer Brother   . Cancer Paternal Uncle     Her Social History Is Significant For: Social History   Social History  . Marital Status: Married    Spouse Name: Jeneen Rinks  . Number of Children: 3  . Years of Education: MA   Occupational History  .     Social History Main Topics  . Smoking status: Never Smoker   . Smokeless tobacco: Never Used  . Alcohol Use: No  . Drug Use: No  . Sexual Activity: Not Asked   Other Topics Concern  . None   Social History Narrative   Patient lives at home with family.   Caffeine Use: none    Her Allergies Are:  Allergies  Allergen Reactions  . Morphine And Related Anaphylaxis  . Amantadine Other (See Comments)    Worsening of dyskinesia  . Codeine   . Iodine   . Latex   . Phenylpropanolamine Other (See Comments)    "KNOCKS ME OUT"  . Povidone-Iodine Itching and Rash    REDNESS  :   Her Current Medications Are:  Outpatient Encounter Prescriptions as of 04/28/2016  Medication Sig  . alendronate (FOSAMAX) 70 MG tablet TAKE 1 TAB BY MOUTH ONCE A WEEK  . Calcium Carb-Cholecalciferol 500-125 MG-UNIT TABS Take by mouth.  . carbidopa-levodopa (SINEMET IR) 25-250 MG tablet TAKE 1/2 TABLET BY MOUTH EVERY 2 HOURS, for a total of 7 or 8 doses  . fish oil-omega-3 fatty acids 1000 MG  capsule Take 2 g by mouth every  other day.   . fludrocortisone (FLORINEF) 0.1 MG tablet Take 1 tablet (100 mcg total) by mouth daily.  Marland Kitchen loratadine (CLARITIN) 10 MG tablet Take 10 mg by mouth daily.  . midodrine (PROAMATINE) 5 MG tablet TAKE 1 TABLET BY MOUTH TWICE A DAY WITH A MEAL  . Multiple Vitamins-Minerals (MULTIVITAMIN PO) Take 1 tablet by mouth daily.  Marland Kitchen omeprazole (PRILOSEC) 20 MG capsule Take 20 mg by mouth daily.  . simvastatin (ZOCOR) 20 MG tablet Take 20 mg by mouth daily.  . [DISCONTINUED] calcium carbonate (TUMS - DOSED IN MG ELEMENTAL CALCIUM) 500 MG chewable tablet Chew 1 tablet by mouth daily.  . [DISCONTINUED] metroNIDAZOLE (METROCREAM) 0.75 % cream Apply topically.  . [DISCONTINUED] Polyethyl Glycol-Propyl Glycol (SYSTANE PRESERVATIVE FREE) 0.4-0.3 % SOLN Place 1 drop into both eyes as needed (in both eyes).    No facility-administered encounter medications on file as of 04/28/2016.  :  Review of Systems:  Out of a complete 14 point review of systems, all are reviewed and negative with the exception of these symptoms as listed below:   Review of Systems  Neurological:       No new concerns per patient and husband.     Objective:  Neurologic Exam  Physical Exam Physical Examination:   Filed Vitals:   04/28/16 1136  BP: 152/90  Pulse: 78  Resp: 16    General Examination: The patient is a very pleasant 70 y.o. female in no acute distress. She is situated in her wheelchair, leaning to L, head tilt to L.   HEENT: Normocephalic, atraumatic, pupils are equal, round and reactive to light and accommodation. Extraocular tracking shows moderate saccadic breakdown without nystagmus noted. She is status post bilateral cataract repairs recently. There is limitation to upper gaze. There is moderate decrease in eye blink rate. Hearing is intact.  She has thick corrective glasses. Face is symmetric with severe facial masking and normal facial sensation. There is no lip, neck or  jaw tremor. Neck is moderately rigid and she has evidence of cervical dystonia with severe left neck tilt and slight right turn and mild anterocollis. There is still fairly good passive range of motion however. There are no carotid bruits on auscultation. Oropharynx exam reveals mild mouth dryness. No significant airway crowding is noted. Mallampati is class II. Tongue protrudes centrally and palate elevates symmetrically. There is mild drooling.   Chest: is clear to auscultation without wheezing, rhonchi or crackles noted.  Heart: sounds are regular and normal without murmurs, rubs or gallops noted.   Abdomen: is soft, non-tender and non-distended with normal bowel sounds appreciated on auscultation.  Extremities: There is trace puffiness in the distal lower extremities bilaterally.  Skin: is warm and dry with no trophic changes noted.  Musculoskeletal: exam reveals no obvious joint deformities, tenderness or joint swelling or erythema.  Neurologically:  Mental status: The patient is awake and alert, paying good  attention. She is able to partially provide the history. Her husband provides details. She is oriented to: person, place, time/date, situation, day of week, month of year and year. Her memory, attention, language and knowledge are impaired mildly. There is no aphasia, agnosia, apraxia or anomia. There is a moderate degree of bradyphrenia. She has word finding difficulties. She is at times very slow in responding. Speech is moderately hypophonic with mild dysarthria noted. Mood is congruent and affect is normal.   On 09/29/2014: MMSE is 26/30, clock drawing is 3/4, animal fluency is 12/min.  On 04/16/2015: MMSE:  28/30, CDT: 2/4, AFT: 5/min.  On 12/24/2015: MMSE: 30/30, CDT: 3/4, AFT: 7/min.  Cranial nerves are as described above under HEENT exam.   Motor exam: Normal bulk, and strength of 4+/5 globally. There are mild to moderate intermittent generalized dyskinesias noted. Tone is  moderately rigid with absence of cogwheeling in the limbs. There is overall severe bradykinesia. There is no drift or rebound. There is no tremor. Romberg was not testable. Reflexes are 1+ in the upper extremities and 1+ in the lower extremities. Fine motor skills exam shows severe impairment in the UEs and LEs.   Cerebellar testing shows no dysmetria or intention tremor on finger to nose testing.   Sensory exam is intact to light touch in the UEs and LEs.   Gait, station and balance exam: She was not asked to stand as she did not bring a walker and is at fall risk. She situated in her wheelchair and leans to the left, unchanged.  Assessment and Plan:    In summary, Marlia Schewe is a very pleasant 70 year old female with a history of advanced Parkinson's disease, left-sided predominant, complicated by orthostatic hypotension, hallucinations, dyskinesias, recurrent falls, chronic constipation, cervical dystonia, RBD, and mild memory loss. Symptoms go back to the 90s. She has been seeing Dr. Linus Mako about once a year since 2015, last visit on 01/26/2016, at which time the possibility of involving palliative care was discussed. He felt that she could follow-up with him on an as-needed basis. She was not deemed a surgical candidate for Parkinson's disease in the past. She was tried on Rytary up to 195 mg 2 pills qid but felt worse on it. She had worsening hallucinations and dyskinesias. She switched back to Sinemet 25-250 mg strength in November 2016, currently 1/2 pill every 2 hours approximately, for a total of about 8 doses. She is encouraged to continue with this. She is encouraged to continue to try to stay as active as possible and drink enough water. Constipation is under reasonable control at this time. She was tried on Nuplazid, but could not tolerate it. She has had some hallucinations but these have been mild and fairly stable at this time. She has been on ProAmatine 5 mg twice daily and  Florinef 0.1 mg once daily with fairly good results and stable blood pressure values. Dr. Linus Mako did not deem her a good candidate for duopa. Her memory is stable, we will continue to monitor. I will see her back in about 4 months, sooner if needed. I answered all the questions today and they were in agreement. I refilled her prescriptions today. I spent 25 minutes in total face-to-face time with the patient, more than 50% of which was spent in counseling and coordination of care, reviewing test results, reviewing medication and discussing or reviewing the diagnosis of advanced PD, its prognosis and treatment options.

## 2016-08-25 ENCOUNTER — Telehealth: Payer: Self-pay

## 2016-08-25 NOTE — Telephone Encounter (Signed)
I was told by phone staff that patient wanted to move her appt sooner. I left message for patient to call back if she was interested in the open appts next week.

## 2016-09-01 ENCOUNTER — Ambulatory Visit: Payer: Medicare Other | Admitting: Neurology

## 2016-09-12 ENCOUNTER — Telehealth: Payer: Self-pay

## 2016-09-12 DIAGNOSIS — G20 Parkinson's disease: Secondary | ICD-10-CM

## 2016-09-12 MED ORDER — FLUDROCORTISONE ACETATE 0.1 MG PO TABS: 100.0000 ug | ORAL_TABLET | Freq: Every day | ORAL | 1 refills | Status: DC

## 2016-09-12 MED ORDER — MIDODRINE HCL 5 MG PO TABS
ORAL_TABLET | ORAL | 1 refills | Status: DC
Start: 2016-09-12 — End: 2016-10-17

## 2016-09-12 NOTE — Telephone Encounter (Signed)
90 day refill request

## 2016-10-17 ENCOUNTER — Ambulatory Visit (INDEPENDENT_AMBULATORY_CARE_PROVIDER_SITE_OTHER): Payer: Medicare Other | Admitting: Neurology

## 2016-10-17 ENCOUNTER — Encounter: Payer: Self-pay | Admitting: Neurology

## 2016-10-17 VITALS — BP 136/82 | HR 72

## 2016-10-17 DIAGNOSIS — I951 Orthostatic hypotension: Secondary | ICD-10-CM

## 2016-10-17 DIAGNOSIS — G249 Dystonia, unspecified: Secondary | ICD-10-CM | POA: Diagnosis not present

## 2016-10-17 DIAGNOSIS — G20 Parkinson's disease: Secondary | ICD-10-CM | POA: Diagnosis not present

## 2016-10-17 DIAGNOSIS — R443 Hallucinations, unspecified: Secondary | ICD-10-CM

## 2016-10-17 MED ORDER — MIDODRINE HCL 5 MG PO TABS: ORAL_TABLET | ORAL | 3 refills | Status: DC

## 2016-10-17 MED ORDER — FLUDROCORTISONE ACETATE 0.1 MG PO TABS: 100.0000 ug | ORAL_TABLET | Freq: Every day | ORAL | 3 refills | Status: DC

## 2016-10-17 MED ORDER — CARBIDOPA-LEVODOPA 25-250 MG PO TABS: ORAL_TABLET | ORAL | 3 refills | Status: DC

## 2016-10-17 NOTE — Patient Instructions (Addendum)
We will continue to monitor your memory scores.  We will continue with Sinemet at the current dose.  We may try Nuplazid again in the future, which is specifically approved by the FDA for Parkinson's associated hallucinations and/or delusion.  For your arm pain you can use tylenol or motrin as needed. You can try a microwavable heatpad for your arm discomfort. Avoid falling asleep with it! Do not use an electric heat pad or blanket due to burn risk! Try to be consistent with using your mouth guard; it may help your mouth and jaw pain.

## 2016-10-17 NOTE — Progress Notes (Signed)
Subjective:    Patient ID: Jenny West is a 70 y.o. female.  HPI     Interim history:   Jenny West is a very pleasant 70 year old right-handed woman with an underlying medical history of recurrent falls with fracture, lower back pain with lumbar radiculopathy, COPD, diplopia, and cancer, who presents for followup consultation of her advanced left-sided predominant Parkinson's disease, complicated by orthostatic hypotension, hallucinations, dyskinesias, sleep disturbance, recurrent falls, cervical dystonia and mild memory loss. She is accompanied by her husband again today. I last saw her on 04/28/2016, at which time she reported feeling fairly stable. She had no recent falls. She had seen Dr. Linus Mako in March 2017. She was continued on levodopa therapy and Dr. Linus Mako at talked to them about potentially involving palliative care.  Today, 10/17/2016: She reports having mouth pain at night, when she opens her mouth it helps. She is seen her dentist for this. She has had some additional visual problems. She has prism eye glasses. Her VH and AH have been worse. She has not fallen. She has an aid M-F from 8 to 2. She is alone for about 4 hours before her husband comes home. She has L arm pain, occasionally, it helps to move it.    Previously:    I saw her on 12/24/2015, at which time she reported that she switched back to regular Sinemet and stop the Rytary, she felt better with this. She had an appointment with Dr. Linus Mako at [facility] pending for 01/26/2016. She had more issues with constipation. She was switched back to Sinemet in November 2016. Hallucinations were stable. She was not drinking enough water. I suggested that she continue with her medication regimen. She was encouraged to drink more water and be more proactive with her constipation issues but to avoid Metamucil.    Of note, the patient no showed for an appointment on 12/03/2015. I saw her on 08/20/2015, at which time she  reported more neck tilt, more wearing off, more hallucinations, less good sleep. She has an aide from 8 AM to 2 PM, Mon-Fri, [place]. She did not always get her 4 doses of Rytary, per husband. He still works full-time (as Dietitian). They have 3 sons, 2 in [place] and 1 in [place]. She would stay in the lift chair most of the day. She was having vivid hallucinations and was not sleeping well at night. She would see disturbing and scary images at night. During the day she sees images of mice or bugs. She had occasional constipation. She had an appointment with Dr. Linus Mako pending for 01/26/2016. She had no recent falls. Her posture was worse. For her Parkinson's related hallucinations I suggested a trial of Nuplazid. I suggested she continue with ProAmatine 5 mg twice daily, Florinef 0.1 mg once daily, and Rytary 195 mg 2 pills 4 times a day. In the interim, the patient called on 09/15/2015 and reported that the Rytary was not helpful and she wanted to stop the medication. She wanted to go back to Sinemet. I suggested she move up her appointment with Dr. Linus Mako and explained to them that medication changes are difficult and not without setback at this point in her disease process.   I saw her on 04/16/2015, at which time she reported having allergy symptoms and excess tearing. She had no recent falls. She still had some hallucinations. She was sleeping in her lift chair. She was not drinking enough water. She had fallen out of her lift chair and had a  bruise on her left shoulder. She was having constipation. I suggested she continue with Rytary 195 mg, 2 pills 4 times a day. This was working reasonably well for her. She also had a follow-up appointment with Dr. Linus Mako.   I saw her on 12/15/2014, at which time her husband reported that they had cut down on the new medication, Rytary, as her insurance did not approve the new prescription. She has was taking 195 mg 3 - 2 - 2, as she was having more dyskinesias and more  significant off time. She had not fallen recently but was more and more in the wheelchair. They have help daily from 8 AM to 2 PM. She was by herself usually from 2 PM to 6 PM. He works full-time, and [employer] but works very close to home. I suggested we try Rytary 3 pills 4 times a day.     I saw her on 09/29/2014, at which time she reported feeling a little worse. She had been changed to Rytary, from a total dose of levodopa at 1750 mg to a total dose of Rytary of 1755 mg daily. We talked about her visit with Dr. Linus Mako. She was not deemed a surgical candidate for DBS placement. I increased her Rytary to 3 pills 4 times a day. She missed an appointment on 12/01/14.   I saw her on 03/27/14, at which time the patient requested a referral for DBS evaluation and I felt that she was not a surgical treatment, but we mutually agreed to proceed for a second opinion and DBS evaluation. I made a referral to [facility], as per patient's request and she saw Dr. Linus Mako. I reviewed his notes, and appreciate his input. In essence, she had an extensive evaluation and was not deemed a surgical candidate. Most recently she was started on Rytary and also the use of Duopa was discussed with the patient and her husband. Rytary at 145 mg strength 3 pills 3 times a day was not helpful. She was then placed on 195 mg 3 pills 3 times a day which she continues to take currently. Her total dose of levodopa at the very end was 1750 mg total dose. Ryrary at the current dose is 1755 mg total for the day.    I saw her on 09/26/2013, at which time I asked her to drink more water and do more leg exercises. I did not make changes to her medications and asked her to continue Florinef, Sinemet, ProAmatine and I referred her to physical therapy.    I first met her on 03/21/13, at which time I felt she was clinically stable and did not make any medication changes but did re-iterate to them the importance of taking the medication  on time.   She previously followed with Dr. Morene Antu and was last seen by him on 12/20/2012, at which time he felt that she was overall doing fairly well but still had significant postural hypotension. She had improved dyskinesias and no significant change in her MMSE on amantadine. Her falls assessment tool score was 14 at the time. Dr. Erling Cruz talked to her about trying a compression hose and increased her midodrine to 5 mg 3 times a day and continued her on Florinef 0.1 mg daily. He requested a BMP at the time, which was unremarkable. In February after a phone conversation Dr. Erling Cruz increased her amantadine to 50 mg twice daily from once daily due to increase in dyskinesias.    She has been  on Amantadine 50 mg twice daily, Florinef 0.1 mg once daily, Zyrtec once daily, Sinemet 25-250 mg strength half a tablet 7 times a day, starting at 7:30 AM, then 9:30 AM, 11:30 AM, 1:30 PM, 3:30 PM, 5 PM and 7 PM. Midodrine 5 mg 2 times a day, omeprazole 20 mg 2 by mouth daily, calcium, multivitamin.   She was diagnosed with Parkinson's disease in 1991 at which time she had difficulty using her left hand. She was placed on Eldepryl and Sinemet. She was first seen by Dr. Erling Cruz in May 1994. She had side effects from medication at the time and had side effects on Permax before then. In December 1995 Parlodel was added. She was on this for several years but developed shortness of breath in 2004 and was tapered off of that medication. She was diagnosed with COPD and in 2008 developed postural hypotension and was started on midodrine after which Florinef was added. She has had motor fluctuations and freezing, postural hypotension and dyskinesias as well as late afternoon hallucinations. She has fallen repeatedly. She does need help with ADLs. She used a walker until December 2013. She fell in September and December 2013 and fractured her left knee. She does not have any history of impulse control disorder. She has urinary  incontinence. She has had double vision treated with prisms. In December 2013 her MMSE was 28, clock drawing was 4, and AFT was 8. In January 2014 her MMSE was 27, clock drawing was 4/4, and animal fluency was 13. CT C-spine in January showed no fracture and mild to moderate degenerative changes.   Her Past Medical History Is Significant For: Past Medical History:  Diagnosis Date  . Cancer ([place])   . COPD (chronic obstructive pulmonary disease) ([place])   . Dyskinesia   . Headache(784.0)   . Knee fracture    left  . Orthostatic hypotension   . Parkinson's disease ([place])   . Reflux     Her Past Surgical History Is Significant For: Past Surgical History:  Procedure Laterality Date  . PARTIAL HIP ARTHROPLASTY Left     Her Family History Is Significant For: Family History  Problem Relation Age of Onset  . Pneumonia Mother   . Heart disease Father   . Cancer Brother   . Cancer Paternal Uncle     Her Social History Is Significant For: Social History   Social History  . Marital status: Married    Spouse name: Jeneen Rinks  . Number of children: 3  . Years of education: MA   Occupational History  .  Retired   Social History Main Topics  . Smoking status: Never Smoker  . Smokeless tobacco: Never Used  . Alcohol use No  . Drug use: No  . Sexual activity: Not Asked   Other Topics Concern  . None   Social History Narrative   Patient lives at home with family.   Caffeine Use: none    Her Allergies Are:  Allergies  Allergen Reactions  . Morphine And Related Anaphylaxis  . Amantadine Other (See Comments)    Worsening of dyskinesia  . Codeine   . Iodine   . Latex   . Phenylpropanolamine Other (See Comments)    "KNOCKS ME OUT"  . Povidone-Iodine Itching and Rash    REDNESS  :   Her Current Medications Are:  Outpatient Encounter Prescriptions as of 10/17/2016  Medication Sig  . alendronate (FOSAMAX) 70 MG tablet TAKE 1 TAB BY MOUTH ONCE A WEEK  .  Calcium  Carb-Cholecalciferol 500-125 MG-UNIT TABS Take by mouth.  . carbidopa-levodopa (SINEMET IR) 25-250 MG tablet TAKE 1/2 TABLET BY MOUTH EVERY 2 HOURS, for a total of 7 or 8 doses  . fish oil-omega-3 fatty acids 1000 MG capsule Take 2 g by mouth every other day.   . fludrocortisone (FLORINEF) 0.1 MG tablet Take 1 tablet (100 mcg total) by mouth daily.  Marland Kitchen loratadine (CLARITIN) 10 MG tablet Take 10 mg by mouth daily.  . midodrine (PROAMATINE) 5 MG tablet TAKE 1 TABLET BY MOUTH TWICE A DAY WITH A MEAL  . Multiple Vitamins-Minerals (MULTIVITAMIN PO) Take 1 tablet by mouth daily.  Marland Kitchen omeprazole (PRILOSEC) 20 MG capsule Take 20 mg by mouth daily.  . simvastatin (ZOCOR) 20 MG tablet Take 20 mg by mouth daily.   No facility-administered encounter medications on file as of 10/17/2016.   :  Review of Systems:  Out of a complete 14 point review of systems, all are reviewed and negative with the exception of these symptoms as listed below:  Review of Systems  Neurological:       Patient reports that her mouth aches at night. When she opens jaw, the pain goes away. She states that it rotates from one side to the other. Has seen dentist for this.  C/O stiffness in upper neck that radiates to head, comes and goes.  Reports visual hallucinations and bad dreams at night.  States she is having vision trouble, has an area in visual field that appears white.     Objective:  Neurologic Exam  Physical Exam Physical Examination:   Vitals:   10/17/16 1144  BP: 136/82  Pulse: 72    General Examination: The patient is a very pleasant 70 y.o. female in no acute distress. She is situated in her wheelchair, leaning to L, head tilt to L, stable.   HEENT: Normocephalic, atraumatic, pupils are equal, round and reactive to light and accommodation. Extraocular tracking shows moderate saccadic breakdown without nystagmus noted. She is status post bilateral cataract repairs recently. There is limitation to upper gaze.  There is moderate decrease in eye blink rate. Hearing is intact.  She has thick corrective glasses, prism lenses. Face is symmetric with severe facial masking and normal facial sensation. There is no lip, neck or jaw tremor. Neck is moderately rigid and she has evidence of cervical dystonia with severe left neck tilt and slight right turn and mild anterocollis. There is still fairly good passive range of motion however. There are no carotid bruits on auscultation. Oropharynx exam reveals mild mouth dryness. No significant airway crowding is noted. Mallampati is class II. Tongue protrudes centrally and palate elevates symmetrically. There is mild drooling.   Chest: is clear to auscultation without wheezing, rhonchi or crackles noted.  Heart: sounds are regular and normal without murmurs, rubs or gallops noted.   Abdomen: is soft, non-tender and non-distended with normal bowel sounds appreciated on auscultation.  Extremities: There is trace puffiness in the distal lower extremities bilaterally.  Skin: is warm and dry with no trophic changes noted.  Musculoskeletal: exam reveals no obvious joint deformities, tenderness or joint swelling or erythema.  Neurologically:  Mental status: The patient is awake and alert, paying good  attention. She is able to partially provide the history. Her husband provides details. She is oriented to: person, place, time/date, situation, day of week, month of year and year. Her memory, attention, language and knowledge are impaired mildly. There is no aphasia, agnosia, apraxia or anomia.  There is a moderate degree of bradyphrenia. She has word finding difficulties. She is at times very slow in responding. Speech is moderately hypophonic with mild dysarthria noted. Mood is congruent and affect is normal.   On 09/29/2014: MMSE is 26/30, clock drawing is 3/4, animal fluency is 12/min.  On 04/16/2015: MMSE: 28/30, CDT: 2/4, AFT: 5/min.  On 12/24/2015: MMSE: 30/30, CDT: 3/4,  AFT: 7/min.  On 10/17/2016: MMSE: 27/30, CDT: 2/4, AFT: 10/min.   Cranial nerves are as described above under HEENT exam.   Motor exam: Normal bulk, and strength of 4+/5 globally. There are mild intermittent generalized dyskinesias noted. Tone is moderately rigid with absence of cogwheeling in the limbs. There is overall severe bradykinesia. There is no drift or rebound. There is a mild intermittent resting tremor in the LUE only. Romberg was not testable. Reflexes are 1+ in the upper extremities and 1+ in the lower extremities. Fine motor skills exam shows severe impairment in the UEs and LEs.   Cerebellar testing shows no dysmetria or intention tremor on finger to nose testing.   Sensory exam is intact to light touch in the UEs and LEs.   Gait, station and balance exam: She was not asked to stand as she did not bring a walker and is at fall risk. She situated in her wheelchair and leans to the left, unchanged.  Assessment and Plan:    In summary, Jenny West is a very pleasant 70 year old female with a history of advanced Parkinson's disease, left-sided predominant, complicated by orthostatic hypotension, hallucinations, dyskinesias, recurrent falls, chronic constipation, cervical dystonia, RBD, and mild memory loss. Symptoms go back to the 90s. She has been seeing Dr. Linus Mako about once a year since 2015, last visit on 01/26/2016, at which time the possibility of involving palliative care was discussed. He felt that she could follow-up with him on an as-needed basis. She was not deemed a surgical candidate for Parkinson's disease in the past. She was tried on Rytary up to 195 mg 2 pills qid but felt worse on it. She had worsening hallucinations and dyskinesias. She switched back to Sinemet 25-250 mg strength in November 2016, currently 1 pill for the 1st dose, then 1/2 pill every 2 hours approximately, for a total of about 8 doses. She is encouraged to continue with this. She is encouraged  to continue to try to stay as active as possible and drink enough water. Constipation is under reasonable control at this time. Her husband works full-time as a [employer] officer. She has help at the house for about 6 hours daily. For her vivid hallucinations, she was tried on Nuplazid, but could not tolerate it. She has been on ProAmatine 5 mg twice daily and Florinef 0.1 mg once daily with fairly good results and stable blood pressure values.  very occasionally, her husband gives her another half of ProAmatine if the blood pressure stays lower than usual. Dr. Linus Mako did not deem her a good candidate for duopa. Her memory is stable, we will continue to monitor.  her scores declined just a little bit, secondary to illegible handwriting and difficulty tracing a figure and her clock drawing has become worse as well, numbers are not legible. I will see her back in about 4 months, sooner if needed. I answered all the questions today and they were in agreement. I refilled her prescriptions today. I spent 25 minutes in total face-to-face time with the patient, more than 50% of which was spent in counseling and coordination of  care, reviewing test results, reviewing medication and discussing or reviewing the diagnosis of advanced PD, its prognosis and treatment options.

## 2017-02-16 ENCOUNTER — Encounter (INDEPENDENT_AMBULATORY_CARE_PROVIDER_SITE_OTHER): Payer: Self-pay

## 2017-02-16 ENCOUNTER — Ambulatory Visit (INDEPENDENT_AMBULATORY_CARE_PROVIDER_SITE_OTHER): Payer: Medicare Other | Admitting: Neurology

## 2017-02-16 ENCOUNTER — Encounter: Payer: Self-pay | Admitting: Neurology

## 2017-02-16 VITALS — BP 132/70 | HR 78

## 2017-02-16 DIAGNOSIS — G20 Parkinson's disease: Secondary | ICD-10-CM

## 2017-02-16 DIAGNOSIS — R443 Hallucinations, unspecified: Secondary | ICD-10-CM | POA: Diagnosis not present

## 2017-02-16 DIAGNOSIS — I951 Orthostatic hypotension: Secondary | ICD-10-CM | POA: Diagnosis not present

## 2017-02-16 DIAGNOSIS — R413 Other amnesia: Secondary | ICD-10-CM

## 2017-02-16 DIAGNOSIS — G20A1 Parkinson's disease without dyskinesia, without mention of fluctuations: Secondary | ICD-10-CM

## 2017-02-16 NOTE — Progress Notes (Signed)
Subjective:    Patient ID: Jenny West is a 71 y.o. female.  HPI     Interim history:  Jenny West is a very pleasant 71 year old right-handed woman with an underlying medical history of recurrent falls with fracture, lower back pain with lumbar radiculopathy, COPD, diplopia, and cancer, who presents for followup consultation of Jenny West advanced left-sided predominant Parkinson's disease, complicated by orthostatic hypotension, hallucinations, dyskinesias, sleep disturbance, recurrent falls, cervical dystonia and mild memory loss. She is accompanied by Jenny West husband again today. I last saw Jenny West on Today, 10/17/2016, at which time she reported minimal pain at night. She had visual problems and had prism eye glasses. She had more hallucinations, auditory and visual hallucinations. Thankfully, she had no recent falls. She had help at the house from 8 AM to 2 PM Monday through Friday, usually by herself about 4 hours before husband comes home after work. She had intermittent left arm pain. Jenny West memory scores were fairly stable: MMSE: 27/30, CDT: 2/4, AFT: 10/min. I suggested we continue with Jenny West current dose of Sinemet.   Today, 02/16/2017: She reports Still having significant daily hallucinations, sometimes scaly at length. She has woken Jenny West husband up at night to check on things that scared Jenny West at night. She does tend to doze off during the day. She has an aid for about 6 hours and then between 2 PM and 6 PM she is at home alone until Jenny West husband comes home from work. She has not had any recent falls or recent illness, does appear to be quite sleepy during the day. Jenny West husband reports that she does not drink enough water, maybe averaging about 2-3 glasses per day. He is wondering how much Jenny West water intake plays a role in Jenny West hallucinations. She continues to take Sinemet 1 pill for the first dose, then 1/2 pill every 2 hours, last dose around 8 PM.very occasionally does she take a second hole pill but  typically they try to stick to this schedule, she has not had severe dyskinesias lately. He has noted that she is much slower in Jenny West swallowing abilities but has not noticed any choking spells.   The patient's allergies, current medications, family history, past medical history, past social history, past surgical history and problem list were reviewed and updated as appropriate.   Previously:    I saw Jenny West on 04/28/2016, at which time she reported feeling fairly stable. She had no recent falls. She had seen Dr. Linus Mako in March 2017. She was continued on levodopa therapy and Dr. Linus Mako at talked to them about potentially involving palliative care.   I saw Jenny West on 12/24/2015, at which time she reported that she switched back to regular Sinemet and stop the Rytary, she felt better with this. She had an appointment with Dr. Linus Mako at [facility] pending for 01/26/2016. She had more issues with constipation. She was switched back to Sinemet in November 2016. Hallucinations were stable. She was not drinking enough water. I suggested that she continue with Jenny West medication regimen. She was encouraged to drink more water and be more proactive with Jenny West constipation issues but to avoid Metamucil.   Of note, the patient no showed for an appointment on 12/03/2015.   I saw Jenny West on 08/20/2015, at which time she reported more neck tilt, more wearing off, more hallucinations, less good sleep. She has an aide from 8 AM to 2 PM, Mon-Fri, [place]. She did not always get Jenny West 4 doses of Rytary, per husband. He still works full-time (  as optometrist). They have 3 sons, 2 in [place] and 1 in [place]. She would stay in the lift chair most of the day. She was having vivid hallucinations and was not sleeping well at night. She would see disturbing and scary images at night. During the day she sees images of mice or bugs. She had occasional constipation. She had an appointment with Dr. Linus Mako pending for 01/26/2016. She had no recent  falls. Jenny West posture was worse. For Jenny West Parkinson's related hallucinations I suggested a trial of Nuplazid. I suggested she continue with ProAmatine 5 mg twice daily, Florinef 0.1 mg once daily, and Rytary 195 mg 2 pills 4 times a day. In the interim, the patient called on 09/15/2015 and reported that the Rytary was not helpful and she wanted to stop the medication. She wanted to go back to Sinemet. I suggested she move up Jenny West appointment with Dr. Linus Mako and explained to them that medication changes are difficult and not without setback at this point in Jenny West disease process.   I saw Jenny West on 04/16/2015, at which time she reported having allergy symptoms and excess tearing. She had no recent falls. She still had some hallucinations. She was sleeping in Jenny West lift chair. She was not drinking enough water. She had fallen out of Jenny West lift chair and had a bruise on Jenny West left shoulder. She was having constipation. I suggested she continue with Rytary 195 mg, 2 pills 4 times a day. This was working reasonably well for Jenny West. She also had a follow-up appointment with Dr. Linus Mako.   I saw Jenny West on 12/15/2014, at which time Jenny West husband reported that they had cut down on the new medication, Rytary, as Jenny West insurance did not approve the new prescription. She has was taking 195 mg 3 - 2 - 2, as she was having more dyskinesias and more significant off time. She had not fallen recently but was more and more in the wheelchair. They have help daily from 8 AM to 2 PM. She was by herself usually from 2 PM to 6 PM. He works full-time, and [employer] but works very close to home. I suggested we try Rytary 3 pills 4 times a day.   I saw Jenny West on 09/29/2014, at which time she reported feeling a little worse. She had been changed to Rytary, from a total dose of levodopa at 1750 mg to a total dose of Rytary of 1755 mg daily. We talked about Jenny West visit with Dr. Linus Mako. She was not deemed a surgical candidate for DBS placement. I  increased Jenny West Rytary to 3 pills 4 times a day. She missed an appointment on 12/01/14.   I saw Jenny West on 03/27/14, at which time the patient requested a referral for DBS evaluation and I felt that she was not a surgical treatment, but we mutually agreed to proceed for a second opinion and DBS evaluation. I made a referral to [facility], as per patient's request and she saw Dr. Linus Mako. I reviewed his notes, and appreciate his input. In essence, she had an extensive evaluation and was not deemed a surgical candidate. Most recently she was started on Rytary and also the use of Duopa was discussed with the patient and Jenny West husband. Rytary at 145 mg strength 3 pills 3 times a day was not helpful. She was then placed on 195 mg 3 pills 3 times a day which she continues to take currently. Jenny West total dose of levodopa at the very end was 1750 mg total  dose. Ryrary at the current dose is 1755 mg total for the day.    I saw Jenny West on 09/26/2013, at which time I asked Jenny West to drink more water and do more leg exercises. I did not make changes to Jenny West medications and asked Jenny West to continue Florinef, Sinemet, ProAmatine and I referred Jenny West to physical therapy.    I first met Jenny West on 03/21/13, at which time I felt she was clinically stable and did not make any medication changes but did re-iterate to them the importance of taking the medication on time.    She previously followed with Dr. Morene Antu and was last seen by him on 12/20/2012, at which time he felt that she was overall doing fairly well but still had significant postural hypotension. She had improved dyskinesias and no significant change in Jenny West MMSE on amantadine. Jenny West falls assessment tool score was 14 at the time. Dr. Erling Cruz talked to Jenny West about trying a compression hose and increased Jenny West midodrine to 5 mg 3 times a day and continued Jenny West on Florinef 0.1 mg daily. He requested a BMP at the time, which was unremarkable. In February after a phone conversation Dr. Erling Cruz increased Jenny West  amantadine to 50 mg twice daily from once daily due to increase in dyskinesias.    She has been on Amantadine 50 mg twice daily, Florinef 0.1 mg once daily, Zyrtec once daily, Sinemet 25-250 mg strength half a tablet 7 times a day, starting at 7:30 AM, then 9:30 AM, 11:30 AM, 1:30 PM, 3:30 PM, 5 PM and 7 PM. Midodrine 5 mg 2 times a day, omeprazole 20 mg 2 by mouth daily, calcium, multivitamin.   She was diagnosed with Parkinson's disease in 1991 at which time she had difficulty using Jenny West left hand. She was placed on Eldepryl and Sinemet. She was first seen by Dr. Erling Cruz in May 1994. She had side effects from medication at the time and had side effects on Permax before then. In December 1995 Parlodel was added. She was on this for several years but developed shortness of breath in 2004 and was tapered off of that medication. She was diagnosed with COPD and in 2008 developed postural hypotension and was started on midodrine after which Florinef was added. She has had motor fluctuations and freezing, postural hypotension and dyskinesias as well as late afternoon hallucinations. She has fallen repeatedly. She does need help with ADLs. She used a walker until December 2013. She fell in September and December 2013 and fractured Jenny West left knee. She does not have any history of impulse control disorder. She has urinary incontinence. She has had double vision treated with prisms. In December 2013 Jenny West MMSE was 28, clock drawing was 4, and AFT was 8. In January 2014 Jenny West MMSE was 27, clock drawing was 4/4, and animal fluency was 13. CT C-spine in January showed no fracture and mild to moderate degenerative changes.   Jenny West Past Medical History Is Significant For: Past Medical History:  Diagnosis Date  . Cancer ([place])   . COPD (chronic obstructive pulmonary disease) ([place])   . Dyskinesia   . Headache(784.0)   . Knee fracture    left  . Orthostatic hypotension   . Parkinson's disease ([place])   . Reflux     Jenny West Past  Surgical History Is Significant For: Past Surgical History:  Procedure Laterality Date  . PARTIAL HIP ARTHROPLASTY Left     Jenny West Family History Is Significant For: Family History  Problem Relation Age of  Onset  . Pneumonia Mother   . Heart disease Father   . Cancer Brother   . Cancer Paternal Uncle     Jenny West Social History Is Significant For: Social History   Social History  . Marital status: Married    Spouse name: Jeneen Rinks  . Number of children: 3  . Years of education: MA   Occupational History  .  Retired   Social History Main Topics  . Smoking status: Never Smoker  . Smokeless tobacco: Never Used  . Alcohol use No  . Drug use: No  . Sexual activity: Not Asked   Other Topics Concern  . None   Social History Narrative   Patient lives at home with family.   Caffeine Use: none    Jenny West Allergies Are:  Allergies  Allergen Reactions  . Morphine And Related Anaphylaxis  . Amantadine Other (See Comments)    Worsening of dyskinesia  . Codeine   . Iodine   . Latex   . Phenylpropanolamine Other (See Comments)    "KNOCKS ME OUT"  . Povidone-Iodine Itching and Rash    REDNESS  :   Jenny West Current Medications Are:  Outpatient Encounter Prescriptions as of 02/16/2017  Medication Sig  . alendronate (FOSAMAX) 70 MG tablet TAKE 1 TAB BY MOUTH ONCE A WEEK  . Calcium Carb-Cholecalciferol 500-125 MG-UNIT TABS Take by mouth.  . calcium carbonate (TUMS - DOSED IN MG ELEMENTAL CALCIUM) 500 MG chewable tablet Chew 1 tablet by mouth daily.  . carbidopa-levodopa (SINEMET IR) 25-250 MG tablet TAKE 1/2 TABLET BY MOUTH EVERY 2 HOURS, for a total of 7 or 8 doses  . cetirizine (ZYRTEC) 10 MG tablet Take 10 mg by mouth daily.  . fish oil-omega-3 fatty acids 1000 MG capsule Take 2 g by mouth every other day.   . fludrocortisone (FLORINEF) 0.1 MG tablet Take 1 tablet (100 mcg total) by mouth daily.  . midodrine (PROAMATINE) 5 MG tablet TAKE 1 TABLET BY MOUTH TWICE A DAY WITH A MEAL  .  Multiple Vitamins-Minerals (MULTIVITAMIN PO) Take 1 tablet by mouth daily.  Marland Kitchen omeprazole (PRILOSEC) 20 MG capsule Take 20 mg by mouth daily.  . simvastatin (ZOCOR) 20 MG tablet Take 20 mg by mouth daily.  . [DISCONTINUED] loratadine (CLARITIN) 10 MG tablet Take 10 mg by mouth daily.   No facility-administered encounter medications on file as of 02/16/2017.   :  Review of Systems:  Out of a complete 14 point review of systems, all are reviewed and negative with the exception of these symptoms as listed below: Review of Systems  Neurological:       Patient states that she is having hallucinations. Increased speech problems. Wakes up many times in the night, possibly due to the hallucinations.     Objective:  Neurologic Exam  Physical Exam Physical Examination:   Vitals:   02/16/17 1143  BP: 132/70  Pulse: 78    General Examination: The patient is a very pleasant 71 y.o. female in no acute distress.She is situated in Jenny West wheelchair, leaning to the left and head tilt forward into the left, worse. She appears deconditioned and frail.  HEENT: Normocephalic, atraumatic, pupils are equal, round and reactive to light and accommodation.  she has difficulty with tracking, she has thick corrective eye glasses with prism lenses. She has significant neck rigidity and cervical dystonia with antero-collis and left lateral call us. Face shows moderate facial masking. Airway examination is otherwise stable, no significant drooling noted. Speech is moderately  hypophonic and mildly dysarthric.   Chest: Clear to auscultation without wheezing, rhonchi or crackles noted.  Heart: S1+S2+0, regular and normal without murmurs, rubs or gallops noted.   Abdomen: Soft, non-tender and non-distended with normal bowel sounds appreciated on auscultation.  Extremities: There is trace pitting edema in the distal lower extremities bilaterally.   Skin: Warm and dry without trophic changes noted.  Musculoskeletal:  exam reveals no obvious joint deformities, tenderness or joint swelling or erythema.   Neurologically:  Mental status: The patient is awake,  days good attention, is able to answer questions appropriately but the majority of Jenny West history is provided by Jenny West husband. Jenny West immediate and remote memory, attention, language skills and fund of knowledge are overall mildly impaired. She has slowness in responding and word finding difficulties.   On 09/29/2014: MMSE is 26/30, clock drawing is 3/4, animal fluency is 12/min.  On 04/16/2015: MMSE: 28/30, CDT: 2/4, AFT: 5/min.  On 12/24/2015: MMSE: 30/30, CDT: 3/4, AFT: 7/min.  On 10/17/2016: MMSE: 27/30, CDT: 2/4, AFT: 10/min.   Cranial nerves II - XII are as described above under HEENT exam.  Motor exam: Normal bulk,  moderate increase in tone, no significant cogwheeling, global strength of 4 out of 5. She has severe bradykinesia, no dyskinesias today. She has no significant resting tremor today. Gait, Romberg, not testable safely. Reflexes are diminished throughout. Sensory exam is intact to light touch. Fine motor skills are moderate to severely impaired.   Assessment and Plan:  In summary, Jenny West is a very pleasant 71 y.o.-year old femaleWith an underlying medical history of falls with fracture, lower back pain with lumbar radiculopathy, COPD, diplopia, and cancer, who presents for followup consultation of Jenny West advanced left-sided predominant Parkinson's disease, complicated by orthostatic hypotension, hallucinations, dyskinesias, sleep disturbance, recurrent falls, cervical dystonia and mild memory loss. She has had medication side effects including hallucinations and dyskinesias with time. She has tried multiple different medications for Jenny West Parkinson's disease over time. She has been evaluated on more than one occasion by Dr. Linus Mako. We have tried Nuplazid in the past in September 2016 but they stopped it due to side effects. She may not have  tried it long enough and we could potentially try it at a lower dose for a longer time. For now we mutually agreed to avoid any medication changes and she is encouraged to drink more water, about 6 cups per day if possible. We will monitor Jenny West memory score and swallowing abilities. Jenny West situation has been more more complicated with time. She is practically wheelchair bound. She has help at the house and a caring husband, but the situation is more than just challenging at this point.  I suggested we continue with Jenny West current medications. She did not need a refill today. I would like to see Jenny West back routinely, we can certainly consider Nuplazid again, and we will talk about it again next time. I answered all Jenny West questions today and the patient and Jenny West husband were in agreement.  I spent 30 minutes in total face-to-face time with the patient, more than 50% of which was spent in counseling and coordination of care, reviewing test results, reviewing medication and discussing or reviewing the diagnosis of PD, its prognosis and treatment options. Pertinent laboratory and imaging test results that were available during this visit with the patient were reviewed by me and considered in my medical decision making (see chart for details).

## 2017-02-16 NOTE — Patient Instructions (Addendum)
We will have to have you drink more water. It may help with your daytime lethargy, and your hallucinations.  We may consider Nuplazid again. Let's talk about it next time. Let's keep your meds the same.

## 2017-06-21 ENCOUNTER — Encounter: Payer: Self-pay | Admitting: Neurology

## 2017-06-21 ENCOUNTER — Ambulatory Visit (INDEPENDENT_AMBULATORY_CARE_PROVIDER_SITE_OTHER): Payer: Medicare Other | Admitting: Neurology

## 2017-06-21 ENCOUNTER — Encounter (INDEPENDENT_AMBULATORY_CARE_PROVIDER_SITE_OTHER): Payer: Self-pay

## 2017-06-21 VITALS — BP 152/84 | HR 68

## 2017-06-21 DIAGNOSIS — R413 Other amnesia: Secondary | ICD-10-CM | POA: Diagnosis not present

## 2017-06-21 DIAGNOSIS — I951 Orthostatic hypotension: Secondary | ICD-10-CM | POA: Diagnosis not present

## 2017-06-21 DIAGNOSIS — G20 Parkinson's disease: Secondary | ICD-10-CM | POA: Diagnosis not present

## 2017-06-21 DIAGNOSIS — R443 Hallucinations, unspecified: Secondary | ICD-10-CM | POA: Diagnosis not present

## 2017-06-21 NOTE — Progress Notes (Signed)
Subjective:    Patient ID: Jenny West is a 71 y.o. female.  HPI     Interim history:   Jenny West is a very pleasant 71 year old right-handed woman with an underlying medical history of recurrent falls with fracture, lower back pain with lumbar radiculopathy, COPD, diplopia, and cancer, who presents for followup consultation of her advanced left-sided predominant Parkinson's disease, complicated by orthostatic hypotension, hallucinations, dyskinesias, sleep disturbance, recurrent falls, cervical dystonia and mild memory loss. She is accompanied by her husband again today. I last saw her on 02/16/2017, at which time she reported having daily hallucinations, sometimes at night, she felt scared at times. She was more sleepy and lethargic during the day. She had help from 2 PM to 6 PM, until her husband comes home from work. She had no recent falls. She was not drinking enough water and I stressed the importance of water intake. I suggested we try or consider trying Nuplazid in the future. I suggested she continue with her Parkinson's regimen otherwise. She was taking Sinemet 1 pill for her first dose, then half a pill every 2 hours, last dose around 8 PM. She was slower in her swallowing capability but no choking spells were reported that her or her husband.  Today, 06/21/2017 (all dictated new, as well as above notes, some dictation done in note pad or Word, outside of chart, may appear as copied):  She reports having ongoing difficulty on a day-to-day basis with very vivid hallucinations. Most of her hallucinations and delusions around around her son, Jenny West. These are unpleasant hallucinations and sometimes scary to her. She would be willing to try Nuplazid again. She has help at the house from 8 AM to 2 PM. Her husband comes home around 6 PM but lives close to his work and can be home if needed very quickly. She has had more difficulty with opening her eyes quickly. Sometimes she keeps her eyes  shut.   The patient's allergies, current medications, family history, past medical history, past social history, past surgical history and problem list were reviewed and updated as appropriate.    Previously (copied from previous notes for reference):   I saw her on 10/17/2016, at which time she reported minimal pain at night. She had visual problems and had prism eye glasses. She had more hallucinations, auditory and visual hallucinations. Thankfully, she had no recent falls. She had help at the house from 8 AM to 2 PM Monday through Friday, usually by herself about 4 hours before husband comes home after work. She had intermittent left arm pain. Her memory scores were fairly stable: MMSE: 27/30, CDT: 2/4, AFT: 10/min. I suggested we continue with her current dose of Sinemet.     I saw her on 04/28/2016, at which time she reported feeling fairly stable. She had no recent falls. She had seen Dr. Linus Mako in March 2017. She was continued on levodopa therapy and Dr. Linus Mako at talked to them about potentially involving palliative care.   I saw her on 12/24/2015, at which time she reported that she switched back to regular Sinemet and stop the Rytary, she felt better with this. She had an appointment with Dr. Linus Mako at [facility] pending for 01/26/2016. She had more issues with constipation. She was switched back to Sinemet in November 2016. Hallucinations were stable. She was not drinking enough water. I suggested that she continue with her medication regimen. She was encouraged to drink more water and be more proactive with her constipation issues  but to avoid Metamucil.   Of note, the patient no showed for an appointment on 12/03/2015.    I saw her on 08/20/2015, at which time she reported more neck tilt, more wearing off, more hallucinations, less good sleep. She has an aide from 8 AM to 2 PM, Mon-Fri, [place]. She did not always get her 4 doses of Rytary, per husband. He still works full-time (as  Dietitian). They have 3 sons, 2 in [place] and 1 in [place]. She would stay in the lift chair most of the day. She was having vivid hallucinations and was not sleeping well at night. She would see disturbing and scary images at night. During the day she sees images of mice or bugs. She had occasional constipation. She had an appointment with Dr. Linus Mako pending for 01/26/2016. She had no recent falls. Her posture was worse. For her Parkinson's related hallucinations I suggested a trial of Nuplazid. I suggested she continue with ProAmatine 5 mg twice daily, Florinef 0.1 mg once daily, and Rytary 195 mg 2 pills 4 times a day. In the interim, the patient called on 09/15/2015 and reported that the Rytary was not helpful and she wanted to stop the medication. She wanted to go back to Sinemet. I suggested she move up her appointment with Dr. Linus Mako and explained to them that medication changes are difficult and not without setback at this point in her disease process.   I saw her on 04/16/2015, at which time she reported having allergy symptoms and excess tearing. She had no recent falls. She still had some hallucinations. She was sleeping in her lift chair. She was not drinking enough water. She had fallen out of her lift chair and had a bruise on her left shoulder. She was having constipation. I suggested she continue with Rytary 195 mg, 2 pills 4 times a day. This was working reasonably well for her. She also had a follow-up appointment with Dr. Linus Mako.   I saw her on 12/15/2014, at which time her husband reported that they had cut down on the new medication, Rytary, as her insurance did not approve the new prescription. She has was taking 195 mg 3 - 2 - 2, as she was having more dyskinesias and more significant off time. She had not fallen recently but was more and more in the wheelchair. They have help daily from 8 AM to 2 PM. She was by herself usually from 2 PM to 6 PM. He works full-time, and [employer] but works very close to home. I suggested we try Rytary 3 pills 4 times a day.   I saw her on 09/29/2014, at which time she reported feeling a little worse. She had been changed to Rytary, from a total dose of levodopa at 1750 mg to a total dose of Rytary of 1755 mg daily. We talked about her visit with Dr. Linus Mako. She was not deemed a surgical candidate for DBS placement. I increased her Rytary to 3 pills 4 times a day. She missed an appointment on 12/01/14.   I saw her on 03/27/14, at which time the patient requested a referral for DBS evaluation and I felt that she was not a surgical treatment, but we mutually agreed to proceed for a second opinion and DBS evaluation. I made a referral to [facility], as per patient's request and she saw Dr. Linus Mako. I reviewed his notes, and appreciate his input. In essence, she had an extensive evaluation and was not deemed a  surgical candidate. Most recently she was started on Rytary and also the use of Duopa was discussed with the patient and her husband. Rytary at 145 mg strength 3 pills 3 times a day was not helpful. She was then placed on 195 mg 3 pills 3 times a day which she continues to take currently. Her total dose of levodopa at the very end was 1750 mg total dose. Ryrary at the current dose is 1755 mg total for the day.    I saw her on 09/26/2013, at which time I asked her to drink more water and do more leg exercises. I did not make changes to her medications and asked her to continue Florinef, Sinemet, ProAmatine and I referred her to physical therapy.    I first met her on 03/21/13, at which time I felt she was clinically stable and did not make any medication changes but did re-iterate to them the importance of taking the medication on time.     She previously followed with Dr. Morene Antu and was last seen by him on 12/20/2012, at which time he felt that she was overall doing fairly well but still had significant postural hypotension. She had improved  dyskinesias and no significant change in her MMSE on amantadine. Her falls assessment tool score was 14 at the time. Dr. Erling Cruz talked to her about trying a compression hose and increased her midodrine to 5 mg 3 times a day and continued her on Florinef 0.1 mg daily. He requested a BMP at the time, which was unremarkable. In February after a phone conversation Dr. Erling Cruz increased her amantadine to 50 mg twice daily from once daily due to increase in dyskinesias.    She has been on Amantadine 50 mg twice daily, Florinef 0.1 mg once daily, Zyrtec once daily, Sinemet 25-250 mg strength half a tablet 7 times a day, starting at 7:30 AM, then 9:30 AM, 11:30 AM, 1:30 PM, 3:30 PM, 5 PM and 7 PM. Midodrine 5 mg 2 times a day, omeprazole 20 mg 2 by mouth daily, calcium, multivitamin.   She was diagnosed with Parkinson's disease in 1991 at which time she had difficulty using her left hand. She was placed on Eldepryl and Sinemet. She was first seen by Dr. Erling Cruz in May 1994. She had side effects from medication at the time and had side effects on Permax before then. In December 1995 Parlodel was added. She was on this for several years but developed shortness of breath in 2004 and was tapered off of that medication. She was diagnosed with COPD and in 2008 developed postural hypotension and was started on midodrine after which Florinef was added. She has had motor fluctuations and freezing, postural hypotension and dyskinesias as well as late afternoon hallucinations. She has fallen repeatedly. She does need help with ADLs. She used a walker until December 2013. She fell in September and December 2013 and fractured her left knee. She does not have any history of impulse control disorder. She has urinary incontinence. She has had double vision treated with prisms. In December 2013 her MMSE was 28, clock drawing was 4, and AFT was 8. In January 2014 her MMSE was 27, clock drawing was 4/4, and animal fluency was 13. CT C-spine in  January showed no fracture and mild to moderate degenerative changes.   Her Past Medical History Is Significant For: Past Medical History:  Diagnosis Date  . Cancer ([place])   . COPD (chronic obstructive pulmonary disease) ([place])   .  Dyskinesia   . Headache(784.0)   . Knee fracture    left  . Orthostatic hypotension   . Parkinson's disease ([place])   . Reflux     Her Past Surgical History Is Significant For: Past Surgical History:  Procedure Laterality Date  . PARTIAL HIP ARTHROPLASTY Left     Her Family History Is Significant For: Family History  Problem Relation Age of Onset  . Pneumonia Mother   . Heart disease Father   . Cancer Brother   . Cancer Paternal Uncle     Her Social History Is Significant For: Social History   Social History  . Marital status: Married    Spouse name: Jeneen Rinks  . Number of children: 3  . Years of education: MA   Occupational History  .  Retired   Social History Main Topics  . Smoking status: Never Smoker  . Smokeless tobacco: Never Used  . Alcohol use No  . Drug use: No  . Sexual activity: Not Asked   Other Topics Concern  . None   Social History Narrative   Patient lives at home with family.   Caffeine Use: none    Her Allergies Are:  Allergies  Allergen Reactions  . Morphine And Related Anaphylaxis  . Amantadine Other (See Comments)    Worsening of dyskinesia  . Codeine   . Iodine   . Latex   . Phenylpropanolamine Other (See Comments)    "KNOCKS ME OUT"  . Povidone-Iodine Itching and Rash    REDNESS  :   Her Current Medications Are:  Outpatient Encounter Prescriptions as of 06/21/2017  Medication Sig  . alendronate (FOSAMAX) 70 MG tablet TAKE 1 TAB BY MOUTH ONCE A WEEK  . Calcium Carb-Cholecalciferol 500-125 MG-UNIT TABS Take by mouth.  . calcium carbonate (TUMS - DOSED IN MG ELEMENTAL CALCIUM) 500 MG chewable tablet Chew 1 tablet by mouth daily.  . carbidopa-levodopa (SINEMET IR) 25-250 MG tablet TAKE 1/2 TABLET BY  MOUTH EVERY 2 HOURS, for a total of 7 or 8 doses  . cetirizine (ZYRTEC) 10 MG tablet Take 10 mg by mouth daily.  . fish oil-omega-3 fatty acids 1000 MG capsule Take 2 g by mouth every other day.   . fludrocortisone (FLORINEF) 0.1 MG tablet Take 1 tablet (100 mcg total) by mouth daily.  . midodrine (PROAMATINE) 5 MG tablet TAKE 1 TABLET BY MOUTH TWICE A DAY WITH A MEAL  . Multiple Vitamins-Minerals (MULTIVITAMIN PO) Take 1 tablet by mouth daily.  Marland Kitchen omeprazole (PRILOSEC) 20 MG capsule Take 20 mg by mouth daily.  . simvastatin (ZOCOR) 20 MG tablet Take 20 mg by mouth daily.   No facility-administered encounter medications on file as of 06/21/2017.   :  Review of Systems:  Out of a complete 14 point review of systems, all are reviewed and negative with the exception of these symptoms as listed below:  Review of Systems  Neurological:       Pt presents today to follow up on her PD and memory. Pt is tolerating sinemet well.    Objective:  Neurological Exam  Physical Exam Physical Examination:   Vitals:   06/21/17 1120  BP: (!) 152/84  Pulse: 68   General Examination: The patient is a very pleasant 71 y.o. female in no acute distress. She appears Frail and deconditioned, significant anterocollis and leaning to the left in her wheelchair.    HEENT: Normocephalic, atraumatic, pupils are equal, round and reactive to light and accommodation. She has difficulty  with tracking, she has thick corrective eye glasses with prism lenses. She has significant neck rigidity and cervical dystonia with severe anterocollis and left laterocollis. Face shows moderate facial masking. Airway examination is otherwise stable, no significant drooling noted. Speech is moderately hypophonic and mild to moderately dysarthric. No evidence of blepharospasm. By description, she has apraxia of eyelid opening.  Chest: Clear to auscultation without wheezing, rhonchi or crackles noted.  Heart: S1+S2+0, regular and normal  without murmurs, rubs or gallops noted.   Abdomen: Soft, non-tender and non-distended with normal bowel sounds appreciated on auscultation.  Extremities: There is trace pitting edema in the distal lower extremities bilaterally.   Skin: Warm and dry without trophic changes noted.  Musculoskeletal: exam reveals no obvious joint deformities, tenderness or joint swelling or erythema.   Neurologically:  Mental status: The patient is awake, pays good attention, is able to answer questions appropriately but the majority of her history is provided by her husband. Her immediate and remote memory, attention, language skills and fund of knowledge are overall mildly impaired. She has slowness in responding and word finding difficulties.   On 09/29/2014: MMSE is 26/30, clock drawing is 3/4, animal fluency is 12/min.  On 04/16/2015: MMSE: 28/30, CDT: 2/4, AFT: 5/min.  On 12/24/2015: MMSE: 30/30, CDT: 3/4, AFT: 7/min.  On 10/17/2016: MMSE: 27/30, CDT: 2/4, AFT: 10/min.   On 06/21/2017: MMSE: 27/30, CDT: 3/4, AFT: 6/min.  Cranial nerves II - XII are as described above under HEENT exam.  Motor exam:  thin bulk, significant slowness in movements, moderate increase in tone, no significant resting tremor, global strength is about 4 out of 5. No dyskinesias and noted today. Romberg is not testable, she's not able to stand. Reflexes are difficult to elicit. Sensory exam is intact to light touch. Fine motor skills are globally moderate to severely impaired.  Assessment and Plan:  In summary, Adalind Weitz is a very pleasant 71 year old female with an underlying medical history of falls with fracture, lower back pain with lumbar radiculopathy, COPD, diplopia, and cancer, who presents for followup consultation of her advanced left-sided predominant Parkinson's disease, complicated by orthostatic hypotension, for which she is on midodrine and and Florinef, hallucinations, dyskinesias, sleep disturbance,  recurrent falls, cervical dystonia and mild memory loss. She has had medication side effects including hallucinations and dyskinesias with time. She has tried multiple different medications for her Parkinson's disease over time. She has been evaluated on more than one occasion by Dr. Linus Mako at [facility]. She tried Nuplazid in September 2016 but they stopped it due to side effects. She may not have tried it long enough and we mutually agreed to try it at a lower dose for a longer time, 17 mg once daily for now. She is advised to continue with her Sinemet at the current schedule. She is reminded to try to drink enough water and memory scores have been stable. We will continue to monitor. She is at this point wheelchair-bound quite debilitated. She may also have significant apraxia of eyelid opening, no telltale evidence of blepharospasm at this time. Thankfully, she has help at the house and a caring husband, but the situation is more and more challenging at this point. She did not need a refill today. I would like to see her back in about 3 months, sooner as needed. I answered all her questions today and the patient and her husband were in agreement. routinely, we can certainly consider Nuplazid again, and we will talk about it again next  time. I answered all her questions today and the patient and her husband were in agreement.  I spent 30 minutes in total face-to-face time with the patient, more than 50% of which was spent in counseling and coordination of care, reviewing test results, reviewing medication and discussing or reviewing the diagnosis of PD, its prognosis and treatment options. Pertinent laboratory and imaging test results that were available during this visit with the patient were reviewed by me and considered in my medical decision making (see chart for details).

## 2017-06-21 NOTE — Patient Instructions (Signed)
We will keep your sinemet, florinef and midodrine the same.   For your Parkinson's related hallucinations and delusions we will try you again on Nuplazid, which is specifically approved by the FDA for this indication.  Please remember, that elderly patients with dementia related psychosis on treatment with an anti-psychotic medication are, generally speaking, at an increased risk of death. Nevertheless, this medication is usually well tolerated. The most common side effects include (but are not limeted to): Swelling, nausea, confusion, hallucinations, constipation and gait and balance problems.  You cannot take this medication if you have a heart condition called prolonged QT, or if you take an antiarrhythmic heart medication, certain anti-psychotic medications and certain anti-biotics such as ketoconazole.   The typical dose for this medication is 17 mg tablets, take 2 pills by mouth once daily, for a total dose of 34 mg once daily. But, for you, we will have you take 1 pill once daily only for now.   There is no need for titration of this medication and it typically does not need to be adjusted for kidney impairment (when the kidney disease is mild to moderate), but the use of this medication is not recommended in patients with liver function impairment.   Remember, that it may take 6 weeks for the medication to work. If you have any serious side effects, of course, you have to stop it immediately and notify us. Call 911 if you have a serious reaction, such as swelling, hives, breathing difficulty.

## 2017-06-29 ENCOUNTER — Telehealth: Payer: Self-pay

## 2017-06-29 NOTE — Telephone Encounter (Signed)
Received nuplazid patient assistance form.  Pt has medicare for insurance, and I am concerned that she does not qualify for patient assistance. Will ask Hinton Dyer to call pt and pt's husband to discuss.

## 2017-06-30 NOTE — Telephone Encounter (Signed)
Jenny West. I haven't done patient assistance for this RX before .

## 2017-07-03 NOTE — Telephone Encounter (Signed)
Will fill out as much as we can on the nuplazid start form. It appears that pt has medicare, so she may not be able to get full patient assistance for nuplazid.

## 2017-07-03 NOTE — Telephone Encounter (Signed)
Nuplazid patient assistance provider portion faxed back to nuplazid connect. Received a receipt of confirmation.

## 2017-07-04 NOTE — Telephone Encounter (Signed)
I called pt to discuss nuplazid patient assistance. We cannot attest that the pt does not have medicare, because she used it at our office. No answer, left a message asking her to call me back.

## 2017-07-05 NOTE — Telephone Encounter (Signed)
I called, spoke with pt and pt's husband. They are completing their portion of the nuplazid patient assistance. I advised them that Dr. Rexene Alberts cannot attest to pt not having insurance since pt used Medicare for her visits at [facility]. Pt and pt's husband verbalized understanding.  I called [employer] back and spoke with Grayland Ormond, advised him that the provider form is complete, pt will need to call [employer] back to discuss her insurance and financial information.

## 2017-07-05 NOTE — Telephone Encounter (Signed)
Grayland Ormond with [employer] is calling to discuss patient assistance form.

## 2017-07-10 ENCOUNTER — Telehealth: Payer: Self-pay | Admitting: Neurology

## 2017-07-10 NOTE — Telephone Encounter (Signed)
[email or web address]  calling to inform that the initial enrollment form is incomplete. They are waiting on the caregiver of pt to complete in addition to proof of income.  Also the provider portion what is missing is the bottom portion it needs the signature of physcian and needs to be dated as well.  Please call [phone] can speak with anyone

## 2017-07-10 NOTE — Telephone Encounter (Signed)
Faxed signed form back to [employer] patient assistance program. Received fax confirmation. Hildred Alamin is waiting on caregiver to send proof of insurance/pt portion of form.

## 2017-07-10 NOTE — Telephone Encounter (Signed)
Called [place] back. Advised from previous messages from [place], [place] that Dr Rexene Alberts could not sign form d/t patient using medicare for OV in the past. Hildred Alamin stated patient has part A and B, but no part D (prescription coverage). She would qualify for patient assistance.   Advised I will have Dr Rexene Alberts sign form and fax it back. Fax: [phone]

## 2017-08-22 NOTE — Telephone Encounter (Signed)
Grayland Ormond with [employer] called office to check status on patient starting mediation.  I advised him patient was to fill out there portion and to contact [employer] directly.  Per Grayland Ormond they have tried to contact the patient with no response.  Please call [phone]

## 2017-08-22 NOTE — Telephone Encounter (Addendum)
Spoke with pt and pt's husband, Jeneen Rinks, per DPR. He says that he is working on the nuplazid patient assistance forms and will send them in today or tomorrow.

## 2017-09-21 ENCOUNTER — Ambulatory Visit: Payer: Medicare Other | Admitting: Neurology

## 2017-11-25 ENCOUNTER — Other Ambulatory Visit: Payer: Self-pay | Admitting: Neurology

## 2017-11-25 DIAGNOSIS — G20 Parkinson's disease: Secondary | ICD-10-CM

## 2017-12-05 ENCOUNTER — Other Ambulatory Visit: Payer: Self-pay | Admitting: Neurology

## 2017-12-05 DIAGNOSIS — G20 Parkinson's disease: Secondary | ICD-10-CM

## 2017-12-05 NOTE — Telephone Encounter (Signed)
I spoke with Dr. Rexene Alberts. She gave permission to refill the midodrine 5mg  tablet BID for one year's supply. RX refilled, sent to [facility].

## 2017-12-05 NOTE — Telephone Encounter (Signed)
Pts husband is requesting a refill for midodrine (PROAMATINE) 5 MG tablet sent to [id number] [place], [place]

## 2018-01-15 ENCOUNTER — Other Ambulatory Visit: Payer: Self-pay | Admitting: Neurology

## 2018-01-15 DIAGNOSIS — G20 Parkinson's disease: Secondary | ICD-10-CM

## 2018-01-15 NOTE — Telephone Encounter (Signed)
I called pt again to discuss, no answer, left a message asking her to call me back. 

## 2018-01-15 NOTE — Telephone Encounter (Signed)
Received a refill for florinef, pt is overdue for a follow up with Dr. Rexene Alberts. I called pt to discuss. Pt is unavailable, per caregiver. I asked caregiver to have pt's husband or pt return my call. If they call back, please offer them an appt with Dr. Rexene Alberts or one of the NPs.

## 2018-01-18 NOTE — Telephone Encounter (Signed)
I called pt, spoke to pt and husband, advised them they pt is overdue for an appt with [facility], an appt was scheduled for 01/25/18 at 1:30pm with Jinny Blossom, NP. Pt verbalized understanding of appt date and time.

## 2018-01-25 ENCOUNTER — Encounter (INDEPENDENT_AMBULATORY_CARE_PROVIDER_SITE_OTHER): Payer: Self-pay

## 2018-01-25 ENCOUNTER — Ambulatory Visit (INDEPENDENT_AMBULATORY_CARE_PROVIDER_SITE_OTHER): Payer: Medicare Other | Admitting: Adult Health

## 2018-01-25 ENCOUNTER — Encounter: Payer: Self-pay | Admitting: Adult Health

## 2018-01-25 VITALS — BP 85/53 | HR 70 | Ht 67.0 in | Wt 128.0 lb

## 2018-01-25 DIAGNOSIS — R443 Hallucinations, unspecified: Secondary | ICD-10-CM

## 2018-01-25 DIAGNOSIS — G20 Parkinson's disease: Secondary | ICD-10-CM

## 2018-01-25 MED ORDER — FLUDROCORTISONE ACETATE 0.1 MG PO TABS: 100.0000 ug | ORAL_TABLET | Freq: Every day | ORAL | 5 refills | Status: DC

## 2018-01-25 MED ORDER — CARBIDOPA-LEVODOPA 25-250 MG PO TABS: ORAL_TABLET | ORAL | 3 refills | Status: DC

## 2018-01-25 NOTE — Patient Instructions (Signed)
Your Plan:  Continue Sinemet Continue florinef and midodrine If your symptoms worsen or you develop new symptoms please let us know.   Thank you for coming to see [place] at [facility]. I hope we have been able to provide you high quality care today.  You may receive a patient satisfaction survey over the next few weeks. We would appreciate your feedback and comments so that we may continue to improve ourselves and the health of our patients.

## 2018-01-25 NOTE — Progress Notes (Signed)
I have read the note, and I agree with the clinical assessment and plan.  Lukis Bunt K Collin Rengel   

## 2018-01-25 NOTE — Progress Notes (Signed)
PATIENT: Jenny West DOB: [birth year]  REASON FOR VISIT: follow up HISTORY FROM: patient  HISTORY OF PRESENT ILLNESS: Today 01/25/18   Ms. Goedde is a 72 year old female with a history of Parkinson's disease and memory disturbance.  She returns today for follow-up.  The patient feels that she has remained relatively stable.  She reports that she is currently not on Nuplazid due to insurance issues.  Her husband states that he is in the process of filling out paperwork however she had surgery for an intestinal blockage and has been 3 months in rehabilitation.  The patient reports that the hallucinations are better.  She states that they are not unpleasant like they were before.  Her hallucinations seem to still be about her son [name].  She reports that her tremors are slightly worse.  They typically occur in the hands but she notices it primarily with activity.  She is wheelchair dependent.  She does require assistance with ADLs.  Denies any trouble sleeping.  She does have some trouble with swallowing liquids and solids.  She reports occasionally she can get checked.  She is currently taking Sinemet half a tablet every 3 hours.  She continues on Florinef and midodrine.  HISTORY /11/2016 Rexene Alberts) She reports having ongoing difficulty on a day-to-day basis with very vivid hallucinations. Most of her hallucinations and delusions around around her son, [name]. These are unpleasant hallucinations and sometimes scary to her. She would be willing to try Nuplazid again. She has help at the house from 8 AM to 2 PM. Her husband comes home around 6 PM but lives close to his work and can be home if needed very quickly. She has had more difficulty with opening her eyes quickly. Sometimes she keeps her eyes shut.  The patient's allergies, current medications, family history, past medical history, past social history, past surgical history and problem list were reviewed and updated as appropriate.     REVIEW OF SYSTEMS: Out of a complete 14 system review of symptoms, the patient complains only of the following symptoms, and all other reviewed systems are negative.   See HPI  ALLERGIES: Allergies  Allergen Reactions  . Morphine And Related Anaphylaxis  . Amantadine Other (See Comments)    Worsening of dyskinesia  . Codeine   . Iodine   . Latex   . Phenylpropanolamine Other (See Comments)    "KNOCKS ME OUT"  . Povidone-Iodine Itching and Rash    REDNESS    HOME MEDICATIONS: Outpatient Medications Prior to Visit  Medication Sig Dispense Refill  . alendronate (FOSAMAX) 70 MG tablet TAKE 1 TAB BY MOUTH ONCE A WEEK  6  . Calcium Carb-Cholecalciferol 500-125 MG-UNIT TABS Take by mouth.    . calcium carbonate (TUMS - DOSED IN MG ELEMENTAL CALCIUM) 500 MG chewable tablet Chew 1 tablet by mouth daily.    . carbidopa-levodopa (SINEMET IR) 25-250 MG tablet TAKE 1/2 TABLET BY MOUTH EVERY 2 HOURS, for a total of 7 or 8 doses (Patient taking differently: TAKE 1/2 TABLET BY MOUTH EVERY 3 HOURS, for a total of 7 or 8 doses) 360 tablet 3  . cetirizine (ZYRTEC) 10 MG tablet Take 10 mg by mouth daily as needed.     . fish oil-omega-3 fatty acids 1000 MG capsule Take 2 g by mouth every other day.     . fludrocortisone (FLORINEF) 0.1 MG tablet TAKE 1 TABLET BY MOUTH EVERY DAY 30 tablet 0  . midodrine (PROAMATINE) 5 MG tablet TAKE 1 TABLET  BY MOUTH TWICE A DAY WITH A MEAL 180 tablet 1  . Multiple Vitamins-Minerals (MULTIVITAMIN PO) Take 1 tablet by mouth daily.    Marland Kitchen omeprazole (PRILOSEC) 20 MG capsule Take 20 mg by mouth daily as needed.     . simvastatin (ZOCOR) 20 MG tablet Take 20 mg by mouth daily.    Debby Freiberg Tartrate (NUPLAZID) 17 MG TABS Take 34 mg by mouth daily.     No facility-administered medications prior to visit.     PAST MEDICAL HISTORY: Past Medical History:  Diagnosis Date  . Cancer ([place])   . COPD (chronic obstructive pulmonary disease) ([place])   . Dyskinesia   .  Headache(784.0)   . Knee fracture    left  . Orthostatic hypotension   . Parkinson's disease ([place])   . Reflux     PAST SURGICAL HISTORY: Past Surgical History:  Procedure Laterality Date  . PARTIAL HIP ARTHROPLASTY Left     FAMILY HISTORY: Family History  Problem Relation Age of Onset  . Pneumonia Mother   . Heart disease Father   . Cancer Brother   . Cancer Paternal Uncle     SOCIAL HISTORY: Social History   Socioeconomic History  . Marital status: Married    Spouse name: Jeneen Rinks  . Number of children: 3  . Years of education: MA  . Highest education level: Not on file  Social Needs  . Financial resource strain: Not on file  . Food insecurity - worry: Not on file  . Food insecurity - inability: Not on file  . Transportation needs - medical: Not on file  . Transportation needs - non-medical: Not on file  Occupational History    Employer: RETIRED  Tobacco Use  . Smoking status: Never Smoker  . Smokeless tobacco: Never Used  Substance and Sexual Activity  . Alcohol use: No    Alcohol/week: 0.0 oz  . Drug use: No  . Sexual activity: Not on file  Other Topics Concern  . Not on file  Social History Narrative   Patient lives at home with family.   Caffeine Use: none      PHYSICAL EXAM  Vitals:   01/25/18 1327  BP: (!) 85/53  Pulse: 70   There is no height or weight on file to calculate BMI.  Generalized: Well developed, in no acute distress   Neurological examination  Mentation: Alert oriented to time, place, history taking. Follows all commands speech is hypophonic and slightly dysarthric masking of the face noted. Cranial nerve II-XII: Pupils were equal round reactive to light. Extraocular movements were full, visual field were full on confrontational test. Facial sensation and strength were normal. Uvula tongue midline.  shoulder shrug  were normal and symmetric.  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  Motor: The motor  testing reveals 5 over 5 strength of all 4 extremities. Good symmetric motor tone is noted throughout.   Sensory: Sensory testing is intact to soft touch on all 4 extremities. No evidence of extinction is noted.  Coordination: Cerebellar testing reveals good finger-nose-finger and heel-to-shin bilaterally.  Gait and station: Wheelchair dependent   DIAGNOSTIC DATA (LABS, IMAGING, TESTING) - I reviewed patient records, labs, notes, testing and imaging myself where available.     ASSESSMENT AND PLAN 72 y.o. year old female  has a past medical history of Cancer ([place]), COPD (chronic obstructive pulmonary disease) ([place]), Dyskinesia, Headache(784.0), Knee fracture, Orthostatic hypotension, Parkinson's disease ([place]), and Reflux. here with:  1.  Parkinson's  disease 2.  Hallucinations  The patient will continue on Sinemet half a tablet every 3 hours during the day for 5-6 doses.  She will continue on Florinef and midodrine.  The patient's hallucinations have improved.  For now she wants to hold off on starting Nuplazid.  Advised that if her symptoms worsen or she develops new symptoms she should let us know.  She will follow-up in 6 months or sooner if needed.       Ward Givens, MSN, NP-C 01/25/2018, 1:31 PM [facility] [address] [place], [place] [address] [phone]

## 2018-02-01 NOTE — Progress Notes (Signed)
Fax confirmation received release for SOVA last visit in Hospital, records received. (small bowel obstruction).  Mailed copy of release to [facility] [address] [place] [address] [phone] since 2 fax #'s have failed.

## 2018-02-14 ENCOUNTER — Other Ambulatory Visit: Payer: Self-pay | Admitting: Neurology

## 2018-02-14 DIAGNOSIS — G20 Parkinson's disease: Secondary | ICD-10-CM

## 2018-02-14 NOTE — Progress Notes (Signed)
I spoke to Cookie with MR, she stated the notes that she had were relating to pt d/c from [facility] about SBO, and strengthening.  Will hold off on faxing notes, if needed next when in next time will use cookie's email.  [email or web address].  [facility]).  I thanked her for looking into this.

## 2018-02-23 ENCOUNTER — Other Ambulatory Visit: Payer: Self-pay | Admitting: Neurology

## 2018-02-23 DIAGNOSIS — G20 Parkinson's disease: Secondary | ICD-10-CM

## 2018-03-01 ENCOUNTER — Other Ambulatory Visit: Payer: Self-pay

## 2018-03-01 DIAGNOSIS — G20 Parkinson's disease: Secondary | ICD-10-CM

## 2018-03-01 MED ORDER — FLUDROCORTISONE ACETATE 0.1 MG PO TABS: 100.0000 ug | ORAL_TABLET | Freq: Every day | ORAL | 0 refills | Status: DC

## 2018-03-27 ENCOUNTER — Telehealth: Payer: Self-pay | Admitting: Adult Health

## 2018-03-27 NOTE — Telephone Encounter (Signed)
I have the paperwork. I have reviewed and will give to [facility].

## 2018-03-27 NOTE — Telephone Encounter (Signed)
Paperwork was faxed to [employer] at the fax number listed on file [phone]. Received a confirmation fax on 03/27/18 at 5:21 pm

## 2018-03-27 NOTE — Telephone Encounter (Signed)
Dawn from [employer] has called for an update on the status of the paperwork that was faxed on 04-30 for Pt.  Dawn can be reached at [phone] xt [phone]

## 2018-03-31 ENCOUNTER — Other Ambulatory Visit: Payer: Self-pay | Admitting: Neurology

## 2018-03-31 DIAGNOSIS — G20 Parkinson's disease: Secondary | ICD-10-CM

## 2018-05-28 ENCOUNTER — Other Ambulatory Visit: Payer: Self-pay | Admitting: Neurology

## 2018-05-28 DIAGNOSIS — G20 Parkinson's disease: Secondary | ICD-10-CM

## 2018-06-01 ENCOUNTER — Other Ambulatory Visit: Payer: Self-pay | Admitting: Neurology

## 2018-06-01 DIAGNOSIS — G20 Parkinson's disease: Secondary | ICD-10-CM

## 2018-08-02 ENCOUNTER — Telehealth: Payer: Self-pay

## 2018-08-02 ENCOUNTER — Ambulatory Visit (INDEPENDENT_AMBULATORY_CARE_PROVIDER_SITE_OTHER): Payer: Medicare Other | Admitting: Neurology

## 2018-08-02 ENCOUNTER — Encounter: Payer: Self-pay | Admitting: Neurology

## 2018-08-02 VITALS — BP 84/51 | HR 73

## 2018-08-02 DIAGNOSIS — I951 Orthostatic hypotension: Secondary | ICD-10-CM

## 2018-08-02 DIAGNOSIS — G20A1 Parkinson's disease without dyskinesia, without mention of fluctuations: Secondary | ICD-10-CM

## 2018-08-02 DIAGNOSIS — R443 Hallucinations, unspecified: Secondary | ICD-10-CM | POA: Diagnosis not present

## 2018-08-02 DIAGNOSIS — R413 Other amnesia: Secondary | ICD-10-CM

## 2018-08-02 DIAGNOSIS — G20 Parkinson's disease: Secondary | ICD-10-CM | POA: Diagnosis not present

## 2018-08-02 DIAGNOSIS — G249 Dystonia, unspecified: Secondary | ICD-10-CM

## 2018-08-02 DIAGNOSIS — G20B1 Parkinson's disease with dyskinesia, without mention of fluctuations: Secondary | ICD-10-CM

## 2018-08-02 MED ORDER — FLUDROCORTISONE ACETATE 0.1 MG PO TABS: 100.0000 ug | ORAL_TABLET | Freq: Every day | ORAL | 3 refills | Status: DC

## 2018-08-02 MED ORDER — CARBIDOPA-LEVODOPA 25-250 MG PO TABS: ORAL_TABLET | ORAL | 3 refills | Status: DC

## 2018-08-02 MED ORDER — MIDODRINE HCL 5 MG PO TABS: 5.0000 mg | ORAL_TABLET | Freq: Two times a day (BID) | ORAL | 3 refills | Status: DC

## 2018-08-02 NOTE — Progress Notes (Signed)
Subjective:    Patient ID: Jenny West is a 72 y.o. female.  HPI     Interim history:  Jenny West is a very pleasant 72 year old right-handed woman with an underlying medical history of recurrent falls with fracture, lower back pain with lumbar radiculopathy, COPD, diplopia, and cancer, who presents for followup consultation of her advanced left-sided predominant Parkinson's disease, complicated by orthostatic hypotension, hallucinations, dyskinesias, sleep disturbance, recurrent falls, cervical dystonia and mild memory loss. She is accompanied by her husband again today. I last saw her on 06/21/2017, at which time she is still struggling with vivid hallucinations. She was agreeable to trying Nuplazid again at a lower dose, we initiated the authorization process for this medication.  The interim seen by Vaughan Browner, Nurse practitioner on 01/25/2018, at which time she reported not being on the Nuplazid and she wanted to hold off trying it.   Today, 08/02/2089 (all dictated new, as well as above notes, some dictation done in note pad or Word, outside of chart, may appear as copied):   She reports that she would like to try Nuplazid again. Her husband did not have a chance to fill out all the paperwork last year, I suggested we start the process again. She had interim hospitalization in [place] after what sounds like small bowel obstruction. She required surgery eventually. She had inpatient rehabilitation also. She still has constipation. She is limited in her mobility at this time. She still takes midodrine and Florinef and takes the Sinemet. Sometimes as she transfers from the wheelchair to another chair or from the lift chair she gets really lightheaded.   The patient's allergies, current medications, family history, past medical history, past social history, past surgical history and problem list were reviewed and updated as appropriate.    Previously (copied from previous  notes for reference):      I saw her on 02/16/2017, at which time she reported having daily hallucinations, sometimes at night, she felt scared at times. She was more sleepy and lethargic during the day. She had help from 2 PM to 6 PM, until her husband comes home from work. She had no recent falls. She was not drinking enough water and I stressed the importance of water intake. I suggested we try or consider trying Nuplazid in the future. I suggested she continue with her Parkinson's regimen otherwise. She was taking Sinemet 1 pill for her first dose, then half a pill every 2 hours, last dose around 8 PM. She was slower in her swallowing capability but no choking spells were reported that her or her husband.   I saw her on 10/17/2016, at which time she reported minimal pain at night. She had visual problems and had prism eye glasses. She had more hallucinations, auditory and visual hallucinations. Thankfully, she had no recent falls. She had help at the house from 8 AM to 2 PM Monday through Friday, usually by herself about 4 hours before husband comes home after work. She had intermittent left arm pain. Her memory scores were fairly stable: MMSE: 27/30, CDT: 2/4, AFT: 10/min. I suggested we continue with her current dose of Sinemet.    I saw her on 04/28/2016, at which time she reported feeling fairly stable. She had no recent falls. She had seen Dr. Linus Mako in March 2017. She was continued on levodopa therapy and Dr. Linus Mako at talked to them about potentially involving palliative care.   I saw her on 12/24/2015, at which time she reported that she switched  back to regular Sinemet and stop the Rytary, she felt better with this. She had an appointment with Dr. Linus Mako at [facility] pending for 01/26/2016. She had more issues with constipation. She was switched back to Sinemet in November 2016. Hallucinations were stable. She was not drinking enough water. I suggested that she continue with her  medication regimen. She was encouraged to drink more water and be more proactive with her constipation issues but to avoid Metamucil.   Of note, the patient no showed for an appointment on 12/03/2015.    I saw her on 08/20/2015, at which time she reported more neck tilt, more wearing off, more hallucinations, less good sleep. She has an aide from 8 AM to 2 PM, Mon-Fri, [place]. She did not always get her 4 doses of Rytary, per husband. He still works full-time (as Dietitian). They have 3 sons, 2 in [place] and 1 in [place]. She would stay in the lift chair most of the day. She was having vivid hallucinations and was not sleeping well at night. She would see disturbing and scary images at night. During the day she sees images of mice or bugs. She had occasional constipation. She had an appointment with Dr. Linus Mako pending for 01/26/2016. She had no recent falls. Her posture was worse. For her Parkinson's related hallucinations I suggested a trial of Nuplazid. I suggested she continue with ProAmatine 5 mg twice daily, Florinef 0.1 mg once daily, and Rytary 195 mg 2 pills 4 times a day. In the interim, the patient called on 09/15/2015 and reported that the Rytary was not helpful and she wanted to stop the medication. She wanted to go back to Sinemet. I suggested she move up her appointment with Dr. Linus Mako and explained to them that medication changes are difficult and not without setback at this point in her disease process.   I saw her on 04/16/2015, at which time she reported having allergy symptoms and excess tearing. She had no recent falls. She still had some hallucinations. She was sleeping in her lift chair. She was not drinking enough water. She had fallen out of her lift chair and had a bruise on her left shoulder. She was having constipation. I suggested she continue with Rytary 195 mg, 2 pills 4 times a day. This was working reasonably well for her. She also had a follow-up appointment with Dr. Linus Mako.    I saw her on 12/15/2014, at which time her husband reported that they had cut down on the new medication, Rytary, as her insurance did not approve the new prescription. She has was taking 195 mg 3 - 2 - 2, as she was having more dyskinesias and more significant off time. She had not fallen recently but was more and more in the wheelchair. They have help daily from 8 AM to 2 PM. She was by herself usually from 2 PM to 6 PM. He works full-time, and [employer] but works very close to home. I suggested we try Rytary 3 pills 4 times a day.   I saw her on 09/29/2014, at which time she reported feeling a little worse. She had been changed to Rytary, from a total dose of levodopa at 1750 mg to a total dose of Rytary of 1755 mg daily. We talked about her visit with Dr. Linus Mako. She was not deemed a surgical candidate for DBS placement. I increased her Rytary to 3 pills 4 times a day. She missed an appointment on 12/01/14.  I saw her on 03/27/14, at which time the patient requested a referral for DBS evaluation and I felt that she was not a surgical treatment, but we mutually agreed to proceed for a second opinion and DBS evaluation. I made a referral to [facility], as per patient's request and she saw Dr. Linus Mako. I reviewed his notes, and appreciate his input. In essence, she had an extensive evaluation and was not deemed a surgical candidate. Most recently she was started on Rytary and also the use of Duopa was discussed with the patient and her husband. Rytary at 145 mg strength 3 pills 3 times a day was not helpful. She was then placed on 195 mg 3 pills 3 times a day which she continues to take currently. Her total dose of levodopa at the very end was 1750 mg total dose. Ryrary at the current dose is 1755 mg total for the day.    I saw her on 09/26/2013, at which time I asked her to drink more water and do more leg exercises. I did not make changes to her medications and asked her to continue  Florinef, Sinemet, ProAmatine and I referred her to physical therapy.    I first met her on 03/21/13, at which time I felt she was clinically stable and did not make any medication changes but did re-iterate to them the importance of taking the medication on time.     She previously followed with Dr. Morene Antu and was last seen by him on 12/20/2012, at which time he felt that she was overall doing fairly well but still had significant postural hypotension. She had improved dyskinesias and no significant change in her MMSE on amantadine. Her falls assessment tool score was 14 at the time. Dr. Erling Cruz talked to her about trying a compression hose and increased her midodrine to 5 mg 3 times a day and continued her on Florinef 0.1 mg daily. He requested a BMP at the time, which was unremarkable. In February after a phone conversation Dr. Erling Cruz increased her amantadine to 50 mg twice daily from once daily due to increase in dyskinesias.    She has been on Amantadine 50 mg twice daily, Florinef 0.1 mg once daily, Zyrtec once daily, Sinemet 25-250 mg strength half a tablet 7 times a day, starting at 7:30 AM, then 9:30 AM, 11:30 AM, 1:30 PM, 3:30 PM, 5 PM and 7 PM. Midodrine 5 mg 2 times a day, omeprazole 20 mg 2 by mouth daily, calcium, multivitamin.   She was diagnosed with Parkinson's disease in 1991 at which time she had difficulty using her left hand. She was placed on Eldepryl and Sinemet. She was first seen by Dr. Erling Cruz in May 1994. She had side effects from medication at the time and had side effects on Permax before then. In December 1995 Parlodel was added. She was on this for several years but developed shortness of breath in 2004 and was tapered off of that medication. She was diagnosed with COPD and in 2008 developed postural hypotension and was started on midodrine after which Florinef was added. She has had motor fluctuations and freezing, postural hypotension and dyskinesias as well as late afternoon  hallucinations. She has fallen repeatedly. She does need help with ADLs. She used a walker until December 2013. She fell in September and December 2013 and fractured her left knee. She does not have any history of impulse control disorder. She has urinary incontinence. She has had double vision treated with prisms.  In December 2013 her MMSE was 28, clock drawing was 4, and AFT was 8. In January 2014 her MMSE was 27, clock drawing was 4/4, and animal fluency was 13. CT C-spine in January showed no fracture and mild to moderate degenerative changes.    Her Past Medical History Is Significant For: Past Medical History:  Diagnosis Date  . Cancer ([place])   . COPD (chronic obstructive pulmonary disease) ([place])   . Dyskinesia   . Headache(784.0)   . Knee fracture    left  . Orthostatic hypotension   . Parkinson's disease ([place])   . Reflux     Her Past Surgical History Is Significant For: Past Surgical History:  Procedure Laterality Date  . PARTIAL HIP ARTHROPLASTY Left     Her Family History Is Significant For: Family History  Problem Relation Age of Onset  . Pneumonia Mother   . Heart disease Father   . Cancer Brother   . Cancer Paternal Uncle     Her Social History Is Significant For: Social History   Socioeconomic History  . Marital status: Married    Spouse name: Jeneen Rinks  . Number of children: 3  . Years of education: MA  . Highest education level: Not on file  Occupational History    Employer: RETIRED  Social Needs  . Financial resource strain: Not on file  . Food insecurity:    Worry: Not on file    Inability: Not on file  . Transportation needs:    Medical: Not on file    Non-medical: Not on file  Tobacco Use  . Smoking status: Never Smoker  . Smokeless tobacco: Never Used  Substance and Sexual Activity  . Alcohol use: No    Alcohol/week: 0.0 standard drinks  . Drug use: No  . Sexual activity: Not on file  Lifestyle  . Physical activity:    Days per week: Not on  file    Minutes per session: Not on file  . Stress: Not on file  Relationships  . Social connections:    Talks on phone: Not on file    Gets together: Not on file    Attends religious service: Not on file    Active member of club or organization: Not on file    Attends meetings of clubs or organizations: Not on file    Relationship status: Not on file  Other Topics Concern  . Not on file  Social History Narrative   Patient lives at home with family.   Caffeine Use: none    Her Allergies Are:  Allergies  Allergen Reactions  . Morphine And Related Anaphylaxis  . Amantadine Other (See Comments)    Worsening of dyskinesia  . Codeine   . Iodine   . Latex   . Phenylpropanolamine Other (See Comments)    "KNOCKS ME OUT"  . Povidone-Iodine Itching and Rash    REDNESS  :   Her Current Medications Are:  Outpatient Encounter Medications as of 08/02/2018  Medication Sig  . alendronate (FOSAMAX) 70 MG tablet TAKE 1 TAB BY MOUTH ONCE A WEEK  . Calcium Carb-Cholecalciferol 500-125 MG-UNIT TABS Take by mouth.  . calcium carbonate (TUMS - DOSED IN MG ELEMENTAL CALCIUM) 500 MG chewable tablet Chew 1 tablet by mouth daily.  . carbidopa-levodopa (SINEMET IR) 25-250 MG tablet TAKE 1/2 TABLET BY MOUTH EVERY 3 HOURS, for a total of 5-6  . cetirizine (ZYRTEC) 10 MG tablet Take 10 mg by mouth daily as needed.   Marland Kitchen  fish oil-omega-3 fatty acids 1000 MG capsule Take 2 g by mouth every other day.   . fludrocortisone (FLORINEF) 0.1 MG tablet Take 1 tablet (100 mcg total) by mouth daily.  . midodrine (PROAMATINE) 5 MG tablet Take 1 tablet (5 mg total) by mouth 2 (two) times daily with a meal.  . Multiple Vitamins-Minerals (MULTIVITAMIN PO) Take 1 tablet by mouth daily.  Marland Kitchen omeprazole (PRILOSEC) 20 MG capsule Take 20 mg by mouth daily as needed.   . simvastatin (ZOCOR) 20 MG tablet Take 20 mg by mouth daily.  . [DISCONTINUED] carbidopa-levodopa (SINEMET IR) 25-250 MG tablet TAKE 1/2 TABLET BY MOUTH EVERY  3 HOURS, for a total of 5-6  . [DISCONTINUED] fludrocortisone (FLORINEF) 0.1 MG tablet TAKE 1 TABLET BY MOUTH EVERY DAY  . [DISCONTINUED] midodrine (PROAMATINE) 5 MG tablet TAKE 1 TABLET BY MOUTH TWICE A DAY WITH A MEAL  . Pimavanserin Tartrate (NUPLAZID) 17 MG TABS Take 34 mg by mouth daily.   No facility-administered encounter medications on file as of 08/02/2018.   :  Review of Systems:  Out of a complete 14 point review of systems, all are reviewed and negative with the exception of these symptoms as listed below: Review of Systems  Neurological:       Pt presents today to follow up on her PD. Pt reports still having hallucinations but has not started nuplazid.    Objective:  Neurological Exam  Physical Exam Physical Examination:   Vitals:   08/02/18 1143  BP: (!) 84/51  Pulse: 73    General Examination: The patient is a very pleasant 72 y.o. female in no acute distress. She appears frail and deconditioned, leaning to the left in the wheelchair, head tilted to the left as well. She is minimally verbal.   HEENT:Normocephalic, atraumatic, pupils are equal, round and reactive to light and accommodation. She has difficulty with tracking, she has thick corrective eye glasses with prism lenses. 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 Face shows moderate facial masking. Airway examination is otherwise stable, no significant drooling noted. Speech is moderately hypophonic and moderately dysarthric. No evidence of blepharospasm.   Chest:Clear to auscultation without wheezing, rhonchi or crackles noted.  Heart:S1+S2+0, regular and normal without murmurs, rubs or gallops noted.   Abdomen:Soft, non-tender and non-distended with normal bowel sounds appreciated on auscultation.  Extremities:There is tracepitting edema in the distal lower extremities bilaterally.   Skin: Warm and dry without trophic changes  noted.  Musculoskeletal: exam reveals no obvious joint deformities, tenderness or joint swelling or erythema.   Neurologically:  Mental status: The patient is awake, pays good attention, is able to answer questions appropriately but the majority of her history is provided by her husband. Her immediate and remote memory, attention, language skills and fund of knowledge areimpaired. She has slowness in responding and word finding difficulties.   On 09/29/2014: MMSE is 26/30, clock drawing is 3/4, animal fluency is 12/min.  On 04/16/2015: MMSE: 28/30, CDT: 2/4, AFT: 5/min.  On 12/24/2015: MMSE: 30/30, CDT: 3/4, AFT: 7/min.  On 10/17/2016: MMSE: 27/30, CDT: 2/4, AFT: 10/min.   On 06/21/2017: MMSE: 27/30, CDT: 3/4, AFT: 6/min.  Cranial nerves II - XII are as described above under HEENT exam.  Motor exam:  thin bulk, significant slowness in movements, moderate increase in tone, no significant resting tremor, global strength is about 4 out of 5. Mild upper body dyskinesias and noted today. She is unable to stand or walk for  me. Reflexes are difficult to elicit. Sensory exam is intact to light touch. Fine motor skills are globally moderate to severely impaired.  Assessment and Plan:  In summary, Aliayah Tyer a 72 year old female with an underlying complex medical history of falls with fracture, lower back pain with lumbar radiculopathy, COPD, diplopia, and cancer, who presents for followup consultation of her advanced left-sided predominant Parkinson's disease, complicated by orthostatic hypotension, for which she is on midodrine and and Florinef, hallucinations, dyskinesias, sleep disturbance, recurrent falls, cervical dystonia, memory loss, constipation and recent admission for bowel obstruction, requiring surgery. She has had medication side effects including hallucinations and dyskinesias with time. She has tried multiple different medications for her Parkinson's disease over time. She  has been evaluated on more than one occasion by Dr. Linus Mako at [facility]. She tried Nuplazid in September 2016 but they stopped it due to side effects. She may not have tried it long enough and we mutually agreed to try it at a lower dose for a longer time, 17 mg once daily for now. We will likely need to restart the paperwork process as she did not start the medication last year as planned. We will keep her other prescriptions the same and I renewed her prescriptions including Sinemet, midodrine and Florinef. She is at this point wheelchair-bound and her mobility is very limited, even transfers sometimes are difficult because of low blood pressure values. She and her husband are reminded to be very proactive about constipation issues. She is advised to add MiraLAX as needed. Thankfully, she has help at the house and a caring husband, but the situation Has become more and more difficult and challenging with time. I suggested a 6 month follow-up, sooner if needed. I answered all their questions today and the patient and her husband were in agreement. I spent 30 minutes in total face-to-face time with the patient, more than 50% of which was spent in counseling and coordination of care, reviewing test results, reviewing medication and discussing or reviewing the diagnosis of PD, its prognosis and treatment options. Pertinent laboratory and imaging test results that were available during this visit with the patient were reviewed by me and considered in my medical decision making (see chart for details).

## 2018-08-02 NOTE — Patient Instructions (Addendum)
We will keep your Sinemet the same, also the midodrine and Florinef.  You have a risk for sudden blood pressure drop with standing up.   We will retry Nuplazid, we may have to resubmit everything.

## 2018-08-02 NOTE — Telephone Encounter (Signed)
I called nuplazid pharmacy, spoke to [place]. To start pt on nuplazid, they need an updated start form, which pt does not need to sign, just the provider. They will then do a benefits investigation to see if the drug is covered and call the pt/husband. They will fax me this form and we will complete it and fax it back to start the process.

## 2018-08-06 NOTE — Telephone Encounter (Signed)
Nuplazid treatment for and pt assistance form filled out and sent to nuplazid pharmacy. Received a receipt of confirmation.

## 2018-08-06 NOTE — Telephone Encounter (Signed)
Received VO from Dr. Rexene Alberts to start pt on nuplazid 10mg  po daily. New start form sent to [facility]. Received a receipt of confirmation.

## 2018-08-06 NOTE — Telephone Encounter (Signed)
Charles/Nu[facility] [phone] called (f) [phone] to advise the 17mg  tab has been d/c. He needs new script sent for 10mg  tab or 34mg  tab.

## 2018-08-06 NOTE — Addendum Note (Signed)
Addended by: Lester Bount[place] A on: 08/06/2018 12:15 PM   Modules accepted: Orders

## 2018-08-07 NOTE — Telephone Encounter (Signed)
Jenny West, pt assistance for nuplazid, called asked to speak with me, and I took the call. He wants to confirm the nuplazid 10 mg dose. He reports that usually the initial dose is 34mg  PO daily. The 10 mg strength is for those pts that need extra nuplazid. He was wondering if Dr. Rexene Alberts would prefer the 34mg  PO daily dose? Or perhaps the 10mg  BID dose (which is not standard.)?

## 2018-08-07 NOTE — Telephone Encounter (Addendum)
I spoke with Dr. Rexene Alberts. She confirms that she wants to the pt to take nuplazid 10mg  daily. Pt has tried the higher dose and had side effects. I called Clair Gulling and reported this info. Clair Gulling asked [place] to complete the last signature on the treatment form and fax back to [employer]. I will ask Dr. Rexene Alberts to look at this today and fax back.  Clair Gulling can be reached at [phone].

## 2018-08-08 NOTE — Telephone Encounter (Signed)
Faxed signed Nuplazid treatment form back to nuplazid pharmacy with required signatures.

## 2018-12-07 ENCOUNTER — Other Ambulatory Visit: Payer: Self-pay | Admitting: Neurology

## 2018-12-07 DIAGNOSIS — G20 Parkinson's disease: Secondary | ICD-10-CM

## 2019-01-31 ENCOUNTER — Encounter: Payer: Self-pay | Admitting: Neurology

## 2019-01-31 ENCOUNTER — Ambulatory Visit (INDEPENDENT_AMBULATORY_CARE_PROVIDER_SITE_OTHER): Payer: Medicare Other | Admitting: Neurology

## 2019-01-31 ENCOUNTER — Other Ambulatory Visit: Payer: Self-pay

## 2019-01-31 VITALS — BP 142/73 | HR 67

## 2019-01-31 DIAGNOSIS — I951 Orthostatic hypotension: Secondary | ICD-10-CM

## 2019-01-31 DIAGNOSIS — R443 Hallucinations, unspecified: Secondary | ICD-10-CM

## 2019-01-31 DIAGNOSIS — R413 Other amnesia: Secondary | ICD-10-CM

## 2019-01-31 DIAGNOSIS — G249 Dystonia, unspecified: Secondary | ICD-10-CM

## 2019-01-31 DIAGNOSIS — G20 Parkinson's disease: Secondary | ICD-10-CM | POA: Diagnosis not present

## 2019-01-31 NOTE — Patient Instructions (Signed)
Please keep up the good work with the fiber intake, increase your fluid intake.  Me mindful about constipation, stay proactive. Try to get to a point of having a bowel movement every other day. You can use some Miralax even daily.  We will keep your meds the same.

## 2019-01-31 NOTE — Progress Notes (Signed)
Subjective:    Patient ID: Jenny West is a 73 y.o. female.  HPI     Interim history:   Jenny West is a very pleasant 73 year old right-handed woman with an underlying medical history of recurrent falls with fracture, lower back pain with lumbar radiculopathy, COPD, diplopia, and cancer, who presents for followup consultation of her advanced left-sided predominant Parkinson's disease, complicated by orthostatic hypotension, hallucinations, dyskinesias, sleep disturbance, recurrent falls, cervical dystonia and mild memory loss. She is accompanied by her husband again today. I last saw her on 08/02/2018, at which time she reported that she would like to try Nuplazid, for her hallucinations. I suggested we try her on a lower dose. She had interim hospitalization in [place] him a [place] for small bowel obstruction. She required surgery for this, she had inpatient rehabilitation afterwards. She was still struggling with constipation.  Today, 01/31/2019 (all dictated new, as well as above notes, some dictation done in note pad or Word, outside of chart, may appear as copied):    She reports that the new medication has helped her hallucinations. Her husband reports that she may have some more confusion since starting the Nuplazid,she seems to tolerate it okay. She has issues with constipation, takes MiraLAX as needed, not daily. May go 2 or 3 days without a bowel movement. She does not always hydrate very well, likes to drink cola, about 16 pounds per day, some cranberry juice, some orange juice. He estimates that she drinks about 1 cup of water. She has ongoing issues with her posture and tilting, she is sedentary, usually in her recliner/lift chair. She has a caretaker during the day when her husband is at work. He has noticed that she tends to be more confused when she wakes up after a nap.   The patient's allergies, current medications, family history, past medical history, past social  history, past surgical history and problem list were reviewed and updated as appropriate.    Previously (copied from previous notes for reference):     I saw her on 06/21/2017, at which time she is still struggling with vivid hallucinations. She was agreeable to trying Nuplazid again at a lower dose, we initiated the authorization process for this medication.   The interim seen by Vaughan Browner, Nurse practitioner on 01/25/2018, at which time she reported not being on the Nuplazid and she wanted to hold off trying it.    I saw her on 02/16/2017, at which time she reported having daily hallucinations, sometimes at night, she felt scared at times. She was more sleepy and lethargic during the day. She had help from 2 PM to 6 PM, until her husband comes home from work. She had no recent falls. She was not drinking enough water and I stressed the importance of water intake. I suggested we try or consider trying Nuplazid in the future. I suggested she continue with her Parkinson's regimen otherwise. She was taking Sinemet 1 pill for her first dose, then half a pill every 2 hours, last dose around 8 PM. She was slower in her swallowing capability but no choking spells were reported that her or her husband.   I saw her on 10/17/2016, at which time she reported minimal pain at night. She had visual problems and had prism eye glasses. She had more hallucinations, auditory and visual hallucinations. Thankfully, she had no recent falls. She had help at the house from 8 AM to 2 PM Monday through Friday, usually by herself about 4 hours before  husband comes home after work. She had intermittent left arm pain. Her memory scores were fairly stable: MMSE: 27/30, CDT: 2/4, AFT: 10/min. I suggested we continue with her current dose of Sinemet.    I saw her on 04/28/2016, at which time she reported feeling fairly stable. She had no recent falls. She had seen Dr. Linus Mako in March 2017. She was continued on levodopa  therapy and Dr. Linus Mako at talked to them about potentially involving palliative care.   I saw her on 12/24/2015, at which time she reported that she switched back to regular Sinemet and stop the Rytary, she felt better with this. She had an appointment with Dr. Linus Mako at [facility] pending for 01/26/2016. She had more issues with constipation. She was switched back to Sinemet in November 2016. Hallucinations were stable. She was not drinking enough water. I suggested that she continue with her medication regimen. She was encouraged to drink more water and be more proactive with her constipation issues but to avoid Metamucil.   Of note, the patient no showed for an appointment on 12/03/2015.    I saw her on 08/20/2015, at which time she reported more neck tilt, more wearing off, more hallucinations, less good sleep. She has an aide from 8 AM to 2 PM, Mon-Fri, [place]. She did not always get her 4 doses of Rytary, per husband. He still works full-time (as Dietitian). They have 3 sons, 2 in [place] and 1 in [place]. She would stay in the lift chair most of the day. She was having vivid hallucinations and was not sleeping well at night. She would see disturbing and scary images at night. During the day she sees images of mice or bugs. She had occasional constipation. She had an appointment with Dr. Linus Mako pending for 01/26/2016. She had no recent falls. Her posture was worse. For her Parkinson's related hallucinations I suggested a trial of Nuplazid. I suggested she continue with ProAmatine 5 mg twice daily, Florinef 0.1 mg once daily, and Rytary 195 mg 2 pills 4 times a day. In the interim, the patient called on 09/15/2015 and reported that the Rytary was not helpful and she wanted to stop the medication. She wanted to go back to Sinemet. I suggested she move up her appointment with Dr. Linus Mako and explained to them that medication changes are difficult and not without setback at this point in her disease  process.   I saw her on 04/16/2015, at which time she reported having allergy symptoms and excess tearing. She had no recent falls. She still had some hallucinations. She was sleeping in her lift chair. She was not drinking enough water. She had fallen out of her lift chair and had a bruise on her left shoulder. She was having constipation. I suggested she continue with Rytary 195 mg, 2 pills 4 times a day. This was working reasonably well for her. She also had a follow-up appointment with Dr. Linus Mako.   I saw her on 12/15/2014, at which time her husband reported that they had cut down on the new medication, Rytary, as her insurance did not approve the new prescription. She has was taking 195 mg 3 - 2 - 2, as she was having more dyskinesias and more significant off time. She had not fallen recently but was more and more in the wheelchair. They have help daily from 8 AM to 2 PM. She was by herself usually from 2 PM to 6 PM. He works full-time, and Engineer, structural  but works very close to home. I suggested we try Rytary 3 pills 4 times a day.   I saw her on 09/29/2014, at which time she reported feeling a little worse. She had been changed to Rytary, from a total dose of levodopa at 1750 mg to a total dose of Rytary of 1755 mg daily. We talked about her visit with Dr. Linus Mako. She was not deemed a surgical candidate for DBS placement. I increased her Rytary to 3 pills 4 times a day. She missed an appointment on 12/01/14.   I saw her on 03/27/14, at which time the patient requested a referral for DBS evaluation and I felt that she was not a surgical treatment, but we mutually agreed to proceed for a second opinion and DBS evaluation. I made a referral to [facility], as per patient's request and she saw Dr. Linus Mako. I reviewed his notes, and appreciate his input. In essence, she had an extensive evaluation and was not deemed a surgical candidate. Most recently she was started on Rytary and also the use of  Duopa was discussed with the patient and her husband. Rytary at 145 mg strength 3 pills 3 times a day was not helpful. She was then placed on 195 mg 3 pills 3 times a day which she continues to take currently. Her total dose of levodopa at the very end was 1750 mg total dose. Ryrary at the current dose is 1755 mg total for the day.    I saw her on 09/26/2013, at which time I asked her to drink more water and do more leg exercises. I did not make changes to her medications and asked her to continue Florinef, Sinemet, ProAmatine and I referred her to physical therapy.    I first met her on 03/21/13, at which time I felt she was clinically stable and did not make any medication changes but did re-iterate to them the importance of taking the medication on time.     She previously followed with Dr. Morene Antu and was last seen by him on 12/20/2012, at which time he felt that she was overall doing fairly well but still had significant postural hypotension. She had improved dyskinesias and no significant change in her MMSE on amantadine. Her falls assessment tool score was 14 at the time. Dr. Erling Cruz talked to her about trying a compression hose and increased her midodrine to 5 mg 3 times a day and continued her on Florinef 0.1 mg daily. He requested a BMP at the time, which was unremarkable. In February after a phone conversation Dr. Erling Cruz increased her amantadine to 50 mg twice daily from once daily due to increase in dyskinesias.    She has been on Amantadine 50 mg twice daily, Florinef 0.1 mg once daily, Zyrtec once daily, Sinemet 25-250 mg strength half a tablet 7 times a day, starting at 7:30 AM, then 9:30 AM, 11:30 AM, 1:30 PM, 3:30 PM, 5 PM and 7 PM. Midodrine 5 mg 2 times a day, omeprazole 20 mg 2 by mouth daily, calcium, multivitamin.   She was diagnosed with Parkinson's disease in 1991 at which time she had difficulty using her left hand. She was placed on Eldepryl and Sinemet. She was first seen by Dr. Erling Cruz  in May 1994. She had side effects from medication at the time and had side effects on Permax before then. In December 1995 Parlodel was added. She was on this for several years but developed shortness of breath in 2004 and  was tapered off of that medication. She was diagnosed with COPD and in 2008 developed postural hypotension and was started on midodrine after which Florinef was added. She has had motor fluctuations and freezing, postural hypotension and dyskinesias as well as late afternoon hallucinations. She has fallen repeatedly. She does need help with ADLs. She used a walker until December 2013. She fell in September and December 2013 and fractured her left knee. She does not have any history of impulse control disorder. She has urinary incontinence. She has had double vision treated with prisms. In December 2013 her MMSE was 28, clock drawing was 4, and AFT was 8. In January 2014 her MMSE was 27, clock drawing was 4/4, and animal fluency was 13. CT C-spine in January showed no fracture and mild to moderate degenerative changes.   Her Past Medical History Is Significant For: Past Medical History:  Diagnosis Date  . Cancer ([place])   . COPD (chronic obstructive pulmonary disease) ([place])   . Dyskinesia   . Headache(784.0)   . Knee fracture    left  . Orthostatic hypotension   . Parkinson's disease ([place])   . Reflux     Her Past Surgical History Is Significant For: Past Surgical History:  Procedure Laterality Date  . PARTIAL HIP ARTHROPLASTY Left     Her Family History Is Significant For: Family History  Problem Relation Age of Onset  . Pneumonia Mother   . Heart disease Father   . Cancer Brother   . Cancer Paternal Uncle     Her Social History Is Significant For: Social History   Socioeconomic History  . Marital status: Married    Spouse name: Jeneen Rinks  . Number of children: 3  . Years of education: MA  . Highest education level: Not on file  Occupational History    Employer:  RETIRED  Social Needs  . Financial resource strain: Not on file  . Food insecurity:    Worry: Not on file    Inability: Not on file  . Transportation needs:    Medical: Not on file    Non-medical: Not on file  Tobacco Use  . Smoking status: Never Smoker  . Smokeless tobacco: Never Used  Substance and Sexual Activity  . Alcohol use: No    Alcohol/week: 0.0 standard drinks  . Drug use: No  . Sexual activity: Not on file  Lifestyle  . Physical activity:    Days per week: Not on file    Minutes per session: Not on file  . Stress: Not on file  Relationships  . Social connections:    Talks on phone: Not on file    Gets together: Not on file    Attends religious service: Not on file    Active member of club or organization: Not on file    Attends meetings of clubs or organizations: Not on file    Relationship status: Not on file  Other Topics Concern  . Not on file  Social History Narrative   Patient lives at home with family.   Caffeine Use: none    Her Allergies Are:  Allergies  Allergen Reactions  . Morphine And Related Anaphylaxis  . Amantadine Other (See Comments)    Worsening of dyskinesia  . Codeine   . Iodine   . Latex   . Phenylpropanolamine Other (See Comments)    "KNOCKS ME OUT"  . Povidone-Iodine Itching and Rash    REDNESS  :   Her Current Medications Are:  Outpatient Encounter Medications as of 01/31/2019  Medication Sig  . alendronate (FOSAMAX) 70 MG tablet TAKE 1 TAB BY MOUTH ONCE A WEEK  . Calcium Carb-Cholecalciferol 500-125 MG-UNIT TABS Take by mouth.  . calcium carbonate (TUMS - DOSED IN MG ELEMENTAL CALCIUM) 500 MG chewable tablet Chew 1 tablet by mouth daily.  . carbidopa-levodopa (SINEMET IR) 25-250 MG tablet TAKE 1/2 TABLET BY MOUTH EVERY 3 HOURS, FOR A TOTAL OF 5-6  . cetirizine (ZYRTEC) 10 MG tablet Take 10 mg by mouth daily as needed.   . fish oil-omega-3 fatty acids 1000 MG capsule Take 2 g by mouth every other day.   . fludrocortisone  (FLORINEF) 0.1 MG tablet Take 1 tablet (100 mcg total) by mouth daily.  . midodrine (PROAMATINE) 5 MG tablet Take 1 tablet (5 mg total) by mouth 2 (two) times daily with a meal.  . Multiple Vitamins-Minerals (MULTIVITAMIN PO) Take 1 tablet by mouth daily.  Marland Kitchen omeprazole (PRILOSEC) 20 MG capsule Take 20 mg by mouth daily as needed.   Marland Kitchen Pimavanserin Tartrate (NUPLAZID) 10 MG TABS Take 10 mg by mouth daily.  . simvastatin (ZOCOR) 20 MG tablet Take 20 mg by mouth daily.   No facility-administered encounter medications on file as of 01/31/2019.   :  Review of Systems:  Out of a complete 14 point review of systems, all are reviewed and negative with the exception of these symptoms as listed below: Review of Systems  Neurological:       Pt presents today for follow up. Pt reports that her left arm hurts. Nuplazid helps with the hallucinations but seems to make her confusion worse. Pt has increasing trouble with feeding herself.    Objective:  Neurological Exam  Physical Exam Physical Examination:   Vitals:   01/31/19 1252  BP: (!) 142/73  Pulse: 67    General Examination: The patient is a very pleasant 73 y.o. female in no acute distress. She is frail and leaving significantly to the left, head tilt to the left, sitting in her wheelchair.  HEENT:Normocephalic, atraumatic, pupils are equal, round and reactive to light and accommodation.She has difficulty with tracking, she has thick corrective eye glasses with prism lenses. She has significant neck rigidity and cervical dystonia withsevereanterocollisand severe left laterocollis.Face shows moderate facial masking. Airway examination is otherwise stable, no significant drooling noted. Speech is moderately hypophonic and moderately dysarthric.No evidence of blepharospasm.   Chest:Clear to auscultation without wheezing, rhonchi or crackles noted.  Heart:S1+S2+0, regular and normal without murmurs, rubs or gallops noted.    Abdomen:Soft, non-tender and non-distended with normal bowel sounds appreciated on auscultation.  Extremities:There is tracepitting edema in the distal lower extremities bilaterally.   Skin: Warm and dry without trophic changes noted.  Musculoskeletal: exam reveals no obvious joint deformities, tenderness or joint swelling or erythema.   Neurologically:  Mental status: The patient is awake,pays good attention, is able to answer questions appropriately but the majority of her history is provided by her husband. Her immediate and remote memory, attention, language skills and fund of knowledge areimpaired. She has severe slowness in responding and word finding difficulties.   On 09/29/2014: MMSE is 26/30, clock drawing is 3/4, animal fluency is 12/min.  On 04/16/2015: MMSE: 28/30, CDT: 2/4, AFT: 5/min.  On 12/24/2015: MMSE: 30/30, CDT: 3/4, AFT: 7/min.  On 10/17/2016: MMSE: 27/30, CDT: 2/4, AFT: 10/min.  On8/11/2016: MMSE: 27/30, CDT:3/4, AFT: 6/min.  On 01/31/2019: MMSE: 28/30, CDT: 1/4, AFT: 5/min.  Cranial nerves II - XII are  as described above under HEENT exam.  Motor exam:thin bulk, significant slowness in movements, moderate increase in tone, no significant resting tremor, global strength is about 4 out of 5. Mild upper body and L foot dyskinesias and noted today. She is unable to stand or walk for me. Reflexes are difficult to elicit. Sensory exam is intact to light touch. Fine motor skills are globally moderate to severely impaired.  Assessment and Plan:  In summary, Jenny West a 73 year old femalewith an underlying complex medical history of falls with fracture, lower back pain with lumbar radiculopathy, COPD, diplopia, and cancer, who presents for followup consultation of her advanced left-sided predominant Parkinson's disease, complicated by orthostatic hypotension,for which she is on midodrine and and Florinef, hallucinations, dyskinesias, sleep  disturbance, recurrent falls, cervical dystonia, memory loss, constipation, s/p bowel obstruction, requiring surgery. she has had a complicated course, developing hallucinations and dyskinesias with time. She has been on multiple different medications for Parkinson's disease over time. She has had more than one evaluation with  Dr. Miguel Aschoff.Shetried Nuplazid in September 2016 but they stopped it due to side effects. She may not have tried it long enough and wemutually agreed totry it at a lower dose. She started this in September. She is tolerating it and reports that her hallucinations are a little bit better. We will keep her other prescriptions the same; she did not need refills today. Unfortunately, she is at this point wheelchair-bound and her mobility is very limited, even transfers sometimes are difficult because of this and low blood pressure values. She and her husband are reminded to be very proactive about constipation issues. She is advised to add MiraLAX as needed. Thankfully, she has help at the house and a caring husband, but the situation Has become more and more difficult and challenging with time. I suggested a 4 month follow-up, sooner if needed. I answered all their questions today and the patient and her husband were in agreement. I spent 25 minutes in total face-to-face time with the patient, more than 50% of which was spent in counseling and coordination of care, reviewing test results, reviewing medication and discussing or reviewing the diagnosis of PD, its prognosis and treatment options. Pertinent laboratory and imaging test results that were available during this visit with the patient were reviewed by me and considered in my medical decision making (see chart for details).

## 2019-03-04 ENCOUNTER — Telehealth: Payer: Self-pay | Admitting: Neurology

## 2019-03-04 NOTE — Telephone Encounter (Signed)
Jenny West @ [facility] has called stating that pt is out of refills on her treatment form and they need a new prescription faxed to [facility] @ [phone]

## 2019-03-05 NOTE — Telephone Encounter (Signed)
Dr. Rexene Alberts signed the order for for nuplazid 10 mg daily. Faxed to [facility]. Received a receipt of confirmation.

## 2019-03-12 NOTE — Telephone Encounter (Signed)
Erin @ [facility] has called stating that on 04-16 they faxed back a treatment form that was missing information.  She states it is missing the Diagnosis Code which is needed so they can send the medication to pt.  Please call

## 2019-03-12 NOTE — Telephone Encounter (Addendum)
I updated the dx code to hallucinations and PD. Faxed treatment form back. Received a receipt of confirmation.

## 2019-06-06 ENCOUNTER — Other Ambulatory Visit: Payer: Self-pay

## 2019-06-06 ENCOUNTER — Telehealth: Payer: Self-pay

## 2019-06-06 ENCOUNTER — Encounter: Payer: Self-pay | Admitting: Neurology

## 2019-06-06 ENCOUNTER — Ambulatory Visit (INDEPENDENT_AMBULATORY_CARE_PROVIDER_SITE_OTHER): Payer: Medicare Other | Admitting: Neurology

## 2019-06-06 VITALS — BP 99/67 | HR 67

## 2019-06-06 DIAGNOSIS — G20 Parkinson's disease: Secondary | ICD-10-CM | POA: Diagnosis not present

## 2019-06-06 DIAGNOSIS — R413 Other amnesia: Secondary | ICD-10-CM | POA: Diagnosis not present

## 2019-06-06 DIAGNOSIS — G249 Dystonia, unspecified: Secondary | ICD-10-CM

## 2019-06-06 DIAGNOSIS — R443 Hallucinations, unspecified: Secondary | ICD-10-CM

## 2019-06-06 DIAGNOSIS — I951 Orthostatic hypotension: Secondary | ICD-10-CM

## 2019-06-06 MED ORDER — CARBIDOPA-LEVODOPA 25-100 MG PO TABS: ORAL_TABLET | ORAL | 3 refills | Status: DC

## 2019-06-06 NOTE — Progress Notes (Signed)
Subjective:    Patient ID: Jenny West is a 73 y.o. female.  HPI    Interim history:   Jenny West is a very pleasant 73 year old right-handed woman with an underlying medical history of recurrent falls with fracture, lower back pain with lumbar radiculopathy, COPD, diplopia, and cancer, who presents for followup consultation of her advanced left-sided predominant Parkinson's disease, complicated by orthostatic hypotension, hallucinations, dyskinesias, sleep disturbance, recurrent falls, cervical dystonia and mild memory loss. She is accompanied by her husband again today. I last saw her on 01/31/2019, at which time she was tolerating the Nuplazid, but per husband, she may have had more confusion since starting the new medication but otherwise was tolerating it.  She felt that it was helping the hallucinations.  She was having constipation, was taking MiraLAX as needed, not daily, she would go up to 3 days without having a bowel movement.  I suggested we keep her medications the same but we talked about the importance of being really proactive about constipation issues.     Today, 06/06/2019 (all dictated new, as well as above notes, some dictation done in note pad or Word, outside of chart, may appear as copied):    She reports very little on her own, reports that she stopped the Midrin and Florinef because of high blood pressure numbers.  He is worried about her low numbers.  He has had systolic values of 15X over diastolic of 45O and 59Y at home.  He has measured blood pressure numbers as high as 130s over 70s and 80s, sometimes she has headache.  He has noticed that she tends to have near syncopal spells or even actual collapse when she walks even for a short distance, such as from her recliner to less than 10 steps.  She continues to take Sinemet 25-250 mg strength 1 pill in the morning and then every 2 hours she takes half a pill for about 6 doses.  She has had better control of her  constipation with the help of MiraLAX.  She does not always drink enough water.  She tolerates the Nuplazid and feels that it has helped tone down her hallucinations from daily and scary wants to nearly daily but benign such as seeing kittens and little children.  The patient's allergies, current medications, family history, past medical history, past social history, past surgical history and problem list were reviewed and updated as appropriate.    Previously (copied from previous notes for reference):   I saw her on 08/02/2018, at which time she reported that she would like to try Nuplazid, for her hallucinations. I suggested we try her on a lower dose. She had interim hospitalization in [place] him a [place] for small bowel obstruction. She required surgery for this, she had inpatient rehabilitation afterwards. She was still struggling with constipation.   I saw her on 06/21/2017, at which time she is still struggling with vivid hallucinations. She was agreeable to trying Nuplazid again at a lower dose, we initiated the authorization process for this medication.   The interim seen by Vaughan Browner, Nurse practitioner on 01/25/2018, at which time she reported not being on the Nuplazid and she wanted to hold off trying it.    I saw her on 02/16/2017, at which time she reported having daily hallucinations, sometimes at night, she felt scared at times. She was more sleepy and lethargic during the day. She had help from 2 PM to 6 PM, until her husband comes home from work. She  had no recent falls. She was not drinking enough water and I stressed the importance of water intake. I suggested we try or consider trying Nuplazid in the future. I suggested she continue with her Parkinson's regimen otherwise. She was taking Sinemet 1 pill for her first dose, then half a pill every 2 hours, last dose around 8 PM. She was slower in her swallowing capability but no choking spells were reported that her or her  husband.   I saw her on 10/17/2016, at which time she reported minimal pain at night. She had visual problems and had prism eye glasses. She had more hallucinations, auditory and visual hallucinations. Thankfully, she had no recent falls. She had help at the house from 8 AM to 2 PM Monday through Friday, usually by herself about 4 hours before husband comes home after work. She had intermittent left arm pain. Her memory scores were fairly stable: MMSE: 27/30, CDT: 2/4, AFT: 10/min. I suggested we continue with her current dose of Sinemet.    I saw her on 04/28/2016, at which time she reported feeling fairly stable. She had no recent falls. She had seen Dr. Linus Mako in March 2017. She was continued on levodopa therapy and Dr. Linus Mako at talked to them about potentially involving palliative care.   I saw her on 12/24/2015, at which time she reported that she switched back to regular Sinemet and stop the Rytary, she felt better with this. She had an appointment with Dr. Linus Mako at [facility] pending for 01/26/2016. She had more issues with constipation. She was switched back to Sinemet in November 2016. Hallucinations were stable. She was not drinking enough water. I suggested that she continue with her medication regimen. She was encouraged to drink more water and be more proactive with her constipation issues but to avoid Metamucil.   Of note, the patient no showed for an appointment on 12/03/2015.    I saw her on 08/20/2015, at which time she reported more neck tilt, more wearing off, more hallucinations, less good sleep. She has an aide from 8 AM to 2 PM, Mon-Fri, [place]. She did not always get her 4 doses of Rytary, per husband. He still works full-time (as Dietitian). They have 3 sons, 2 in [place] and 1 in [place]. She would stay in the lift chair most of the day. She was having vivid hallucinations and was not sleeping well at night. She would see disturbing and scary images at night. During the day she  sees images of mice or bugs. She had occasional constipation. She had an appointment with Dr. Linus Mako pending for 01/26/2016. She had no recent falls. Her posture was worse. For her Parkinson's related hallucinations I suggested a trial of Nuplazid. I suggested she continue with ProAmatine 5 mg twice daily, Florinef 0.1 mg once daily, and Rytary 195 mg 2 pills 4 times a day. In the interim, the patient called on 09/15/2015 and reported that the Rytary was not helpful and she wanted to stop the medication. She wanted to go back to Sinemet. I suggested she move up her appointment with Dr. Linus Mako and explained to them that medication changes are difficult and not without setback at this point in her disease process.   I saw her on 04/16/2015, at which time she reported having allergy symptoms and excess tearing. She had no recent falls. She still had some hallucinations. She was sleeping in her lift chair. She was not drinking enough water. She had fallen out of her  lift chair and had a bruise on her left shoulder. She was having constipation. I suggested she continue with Rytary 195 mg, 2 pills 4 times a day. This was working reasonably well for her. She also had a follow-up appointment with Dr. Linus Mako.   I saw her on 12/15/2014, at which time her husband reported that they had cut down on the new medication, Rytary, as her insurance did not approve the new prescription. She has was taking 195 mg 3 - 2 - 2, as she was having more dyskinesias and more significant off time. She had not fallen recently but was more and more in the wheelchair. They have help daily from 8 AM to 2 PM. She was by herself usually from 2 PM to 6 PM. He works full-time, and [employer] but works very close to home. I suggested we try Rytary 3 pills 4 times a day.   I saw her on 09/29/2014, at which time she reported feeling a little worse. She had been changed to Rytary, from a total dose of levodopa at 1750 mg to a  total dose of Rytary of 1755 mg daily. We talked about her visit with Dr. Linus Mako. She was not deemed a surgical candidate for DBS placement. I increased her Rytary to 3 pills 4 times a day. She missed an appointment on 12/01/14.   I saw her on 03/27/14, at which time the patient requested a referral for DBS evaluation and I felt that she was not a surgical treatment, but we mutually agreed to proceed for a second opinion and DBS evaluation. I made a referral to [facility], as per patient's request and she saw Dr. Linus Mako. I reviewed his notes, and appreciate his input. In essence, she had an extensive evaluation and was not deemed a surgical candidate. Most recently she was started on Rytary and also the use of Duopa was discussed with the patient and her husband. Rytary at 145 mg strength 3 pills 3 times a day was not helpful. She was then placed on 195 mg 3 pills 3 times a day which she continues to take currently. Her total dose of levodopa at the very end was 1750 mg total dose. Ryrary at the current dose is 1755 mg total for the day.    I saw her on 09/26/2013, at which time I asked her to drink more water and do more leg exercises. I did not make changes to her medications and asked her to continue Florinef, Sinemet, ProAmatine and I referred her to physical therapy.    I first met her on 03/21/13, at which time I felt she was clinically stable and did not make any medication changes but did re-iterate to them the importance of taking the medication on time.     She previously followed with Dr. Morene Antu and was last seen by him on 12/20/2012, at which time he felt that she was overall doing fairly well but still had significant postural hypotension. She had improved dyskinesias and no significant change in her MMSE on amantadine. Her falls assessment tool score was 14 at the time. Dr. Erling Cruz talked to her about trying a compression hose and increased her midodrine to 5 mg 3 times a day and continued her on  Florinef 0.1 mg daily. He requested a BMP at the time, which was unremarkable. In February after a phone conversation Dr. Erling Cruz increased her amantadine to 50 mg twice daily from once daily due to increase in dyskinesias.  She has been on Amantadine 50 mg twice daily, Florinef 0.1 mg once daily, Zyrtec once daily, Sinemet 25-250 mg strength half a tablet 7 times a day, starting at 7:30 AM, then 9:30 AM, 11:30 AM, 1:30 PM, 3:30 PM, 5 PM and 7 PM. Midodrine 5 mg 2 times a day, omeprazole 20 mg 2 by mouth daily, calcium, multivitamin.   She was diagnosed with Parkinson's disease in 1991 at which time she had difficulty using her left hand. She was placed on Eldepryl and Sinemet. She was first seen by Dr. Erling Cruz in May 1994. She had side effects from medication at the time and had side effects on Permax before then. In December 1995 Parlodel was added. She was on this for several years but developed shortness of breath in 2004 and was tapered off of that medication. She was diagnosed with COPD and in 2008 developed postural hypotension and was started on midodrine after which Florinef was added. She has had motor fluctuations and freezing, postural hypotension and dyskinesias as well as late afternoon hallucinations. She has fallen repeatedly. She does need help with ADLs. She used a walker until December 2013. She fell in September and December 2013 and fractured her left knee. She does not have any history of impulse control disorder. She has urinary incontinence. She has had double vision treated with prisms. In December 2013 her MMSE was 28, clock drawing was 4, and AFT was 8. In January 2014 her MMSE was 27, clock drawing was 4/4, and animal fluency was 13. CT C-spine in January showed no fracture and mild to moderate degenerative changes.    Her Past Medical History Is Significant For: Past Medical History:  Diagnosis Date  . Cancer ([place])   . COPD (chronic obstructive pulmonary disease) ([place])   .  Dyskinesia   . Headache(784.0)   . Knee fracture    left  . Orthostatic hypotension   . Parkinson's disease ([place])   . Reflux     Her Past Surgical History Is Significant For: Past Surgical History:  Procedure Laterality Date  . PARTIAL HIP ARTHROPLASTY Left     Her Family History Is Significant For: Family History  Problem Relation Age of Onset  . Pneumonia Mother   . Heart disease Father   . Cancer Brother   . Cancer Paternal Uncle     Her Social History Is Significant For: Social History   Socioeconomic History  . Marital status: Married    Spouse name: Jeneen Rinks  . Number of children: 3  . Years of education: MA  . Highest education level: Not on file  Occupational History    Employer: RETIRED  Social Needs  . Financial resource strain: Not on file  . Food insecurity    Worry: Not on file    Inability: Not on file  . Transportation needs    Medical: Not on file    Non-medical: Not on file  Tobacco Use  . Smoking status: Never Smoker  . Smokeless tobacco: Never Used  Substance and Sexual Activity  . Alcohol use: No    Alcohol/week: 0.0 standard drinks  . Drug use: No  . Sexual activity: Not on file  Lifestyle  . Physical activity    Days per week: Not on file    Minutes per session: Not on file  . Stress: Not on file  Relationships  . Social Herbalist on phone: Not on file    Gets together: Not on file  Attends religious service: Not on file    Active member of club or organization: Not on file    Attends meetings of clubs or organizations: Not on file    Relationship status: Not on file  Other Topics Concern  . Not on file  Social History Narrative   Patient lives at home with family.   Caffeine Use: none    Her Allergies Are:  Allergies  Allergen Reactions  . Morphine And Related Anaphylaxis  . Amantadine Other (See Comments)    Worsening of dyskinesia  . Codeine   . Iodine   . Latex   . Phenylpropanolamine Other (See  Comments)    "KNOCKS ME OUT"  . Povidone-Iodine Itching and Rash    REDNESS  :   Her Current Medications Are:  Outpatient Encounter Medications as of 06/06/2019  Medication Sig  . Calcium Carb-Cholecalciferol 500-125 MG-UNIT TABS Take by mouth.  . calcium carbonate (TUMS - DOSED IN MG ELEMENTAL CALCIUM) 500 MG chewable tablet Chew 1 tablet by mouth daily.  . carbidopa-levodopa (SINEMET IR) 25-250 MG tablet TAKE 1/2 TABLET BY MOUTH EVERY 3 HOURS, FOR A TOTAL OF 5-6  . cetirizine (ZYRTEC) 10 MG tablet Take 10 mg by mouth daily as needed.   . Multiple Vitamins-Minerals (MULTIVITAMIN PO) Take 1 tablet by mouth daily.  Marland Kitchen omeprazole (PRILOSEC) 20 MG capsule Take 20 mg by mouth daily as needed.   Marland Kitchen Pimavanserin Tartrate (NUPLAZID) 10 MG TABS Take 10 mg by mouth daily.  Marland Kitchen alendronate (FOSAMAX) 70 MG tablet TAKE 1 TAB BY MOUTH ONCE A WEEK  . fish oil-omega-3 fatty acids 1000 MG capsule Take 2 g by mouth every other day.   . fludrocortisone (FLORINEF) 0.1 MG tablet Take 1 tablet (100 mcg total) by mouth daily. (Patient not taking: Reported on 06/06/2019)  . midodrine (PROAMATINE) 5 MG tablet Take 1 tablet (5 mg total) by mouth 2 (two) times daily with a meal. (Patient not taking: Reported on 06/06/2019)  . simvastatin (ZOCOR) 20 MG tablet Take 20 mg by mouth daily.   No facility-administered encounter medications on file as of 06/06/2019.   :  Review of Systems:  Out of a complete 14 point review of systems, all are reviewed and negative with the exception of these symptoms as listed below: Review of Systems  Neurological:       Pt presents today to discuss her PD. Pt's husband is concerned about her low BP. Pt's husband reports that she has stopped taking some of her medications on her own.    Objective:  Neurological Exam  Physical Exam Physical Examination:   Vitals:   06/06/19 1142  BP: 99/67  Pulse: 67    General Examination: The patient is a very pleasant 73 y.o. female in no  acute distress. She appears, frail, in [facility], more slumped to her L, quiet, slower to respond, well groomed.   HEENT:Normocephalic, atraumatic, pupils are equal, round and reactive to light and accommodation.She has difficulty with tracking, she has thick corrective eye glasses with prism lenses. She has significant neck rigidity and cervical dystonia withsevereanterocollisand severe left laterocollis.Face shows moderate facial masking. Airway examination is otherwise stable, no significant drooling noted. Speech is moderately hypophonic and moderately dysarthric.No evidence of blepharospasm.   Chest:Clear to auscultation without wheezing, rhonchi or crackles noted.  Heart:S1+S2+0, regular and normal without murmurs, rubs or gallops noted.   Abdomen:Soft, non-tender and non-distended with normal bowel sounds appreciated on auscultation.  Extremities:There is tracepitting edema in the distal  lower extremities bilaterally, more so on the R.   Skin: Warm and dry without trophic changes noted.  Musculoskeletal: exam reveals no obvious joint deformities, tenderness or joint swelling or erythema.   Neurologically:  Mental status: The patient is awake,pays fairly good attention, is able to answer questions appropriately but the majority of her history is provided by her husband. Her immediate and remote memory, attention, language skills and fund of knowledge areimpaired. She has severe slowness in responding and word finding difficulties.   On 09/29/2014: MMSE is 26/30, clock drawing is 3/4, animal fluency is 12/min.  On 04/16/2015: MMSE: 28/30, CDT: 2/4, AFT: 5/min.  On 12/24/2015: MMSE: 30/30, CDT: 3/4, AFT: 7/min.  On 10/17/2016: MMSE: 27/30, CDT: 2/4, AFT: 10/min.  On8/11/2016: MMSE: 27/30, CDT:3/4, AFT: 6/min.  On 01/31/2019: MMSE: 28/30, CDT: 1/4, AFT: 5/min.  Cranial nerves II - XII are as described above under HEENT exam.  Motor exam:thin bulk, significant  slowness in movements, moderate increase in tone, no significant resting tremor, global strength is about 4 out of 5.Less dyskinesias noted today. She is unable to stand or walk for me.Reflexes are difficult to elicit. Sensory exam is intact to light touch. Fine motor skills are globally severely impaired.  Assessment and Plan:  In summary, Jenny West a16 year old femalewith an underlyingcomplexmedical history of falls with fracture, lower back pain with lumbar radiculopathy, COPD, diplopia, and cancer, who presents for followup consultation of her advanced left-sided predominant Parkinson's disease, complicated by orthostatic hypotension,for which she is on midodrine and and Florinef, hallucinations, dyskinesias, sleep disturbance, recurrent falls, cervical dystonia, memory loss, constipation, s/p bowel obstruction, requiring surgery. she has had a complicated course, developing hallucinations and dyskinesias with time. She has been on multiple different medications for Parkinson's disease over time. She has had more than one evaluation with  Dr. Miguel Aschoff.Shetried Nuplazid in September 2016 but they stopped it due to side effects. She may not have tried it long enough and wemutually agreed totry it at a lower dose. She started this in September. She is tolerating it and reports that her hallucinations are better. We will keep her other prescriptions the same. Unfortunately, she is at this point wheelchair-bound and her mobility is very limited, even transfers sometimes are difficult because of this and low blood pressure values. She and her husband are reminded to be very proactive about constipation issues. She is advised to add MiraLAX as needed.Thankfully, she has help at the house and a caring husband, but the situationHas become more and more difficult and challenging with time. I suggested we switch her from 25-250 mg strength Sinemet to 25-100 mg strength 1 pill every 2 hours  starting at 7 AM for a total of 7 doses, this will bring down the individual dose and perhaps will help with low blood pressure values, they are advised to keep a log of her blood pressure, such as for a week about 3 times a day at the same times and restart the Midodrine at least for one dose daily and Keep a log after restarting it as well.  They are advised to follow in 3 months for follow-up with the NP to check how the C/L dose is working out and how the BP logs are looking. We will continue the Nuplazid. I answered all their questions today and the patient and her husband were in agreement.  I spent 25 minutes in total face-to-face time with the patient, more than 50% of which was spent in counseling and  coordination of care, reviewing test results, reviewing medication and discussing or reviewing the diagnosis of PD, its prognosis and treatment options. Pertinent laboratory and imaging test results that were available during this visit with the patient were reviewed by me and considered in my medical decision making (see chart for details).

## 2019-06-06 NOTE — Telephone Encounter (Signed)
I called pt, spoke to pt's husband, and scheduled pt for her 3 mo follow up. Pt verbalized understanding of new appt date and time.

## 2019-06-06 NOTE — Patient Instructions (Addendum)
We will change Your Sinemet prescription from 25/250 mg strength half pill every 2 hours to 25/100 mg strength 1 pill every 2 hours for total of 7 doses.  Please keep a blood pressure log and check it every day at the same time such as 10 AM, 3 PM and 7 PM daily for the next week and restart your Midodrine for at least 1 pill daily, Keep a log for at least 1 week after restarting it.

## 2019-09-19 ENCOUNTER — Ambulatory Visit (INDEPENDENT_AMBULATORY_CARE_PROVIDER_SITE_OTHER): Payer: Medicare Other | Admitting: Adult Health

## 2019-09-19 ENCOUNTER — Encounter: Payer: Self-pay | Admitting: Adult Health

## 2019-09-19 ENCOUNTER — Other Ambulatory Visit: Payer: Self-pay

## 2019-09-19 VITALS — BP 100/62 | HR 68 | Temp 95.6°F | Ht 66.0 in

## 2019-09-19 DIAGNOSIS — G249 Dystonia, unspecified: Secondary | ICD-10-CM

## 2019-09-19 DIAGNOSIS — I951 Orthostatic hypotension: Secondary | ICD-10-CM | POA: Diagnosis not present

## 2019-09-19 DIAGNOSIS — G20 Parkinson's disease: Secondary | ICD-10-CM | POA: Diagnosis not present

## 2019-09-19 MED ORDER — CARBIDOPA-LEVODOPA 25-250 MG PO TABS: ORAL_TABLET | ORAL | 3 refills | Status: DC

## 2019-09-19 NOTE — Patient Instructions (Signed)
Your Plan:  Decrease sinemet to 1/2 tablet every 2 hours with dose starting at 8 and last dose at 8PM Continue to monitor BP If your symptoms worsen or you develop new symptoms please let us know.       Thank you for coming to see [place] at [facility]. I hope we have been able to provide you high quality care today.  You may receive a patient satisfaction survey over the next few weeks. We would appreciate your feedback and comments so that we may continue to improve ourselves and the health of our patients.

## 2019-09-19 NOTE — Progress Notes (Addendum)
PATIENT: Jenny West DOB: 05-04-1946  REASON FOR VISIT: follow up HISTORY FROM: patient  HISTORY OF PRESENT ILLNESS: Today 09/19/19:  Jenny West is a 73 year old female with a history of Parkinson's disease.  She returns today for follow-up.  At the last visit Sinemet was reduced to address blood pressure concerns.  She was switched to the 25-100 mg strength however the husband states that he noticed a change in her movements and therefore he switched her back to 25-250 mg strength.  He states that he gives her 1 tablet around 8:00 and then a half a tablet every 2 hours with the last dose at 8 PM.  The blood pressure log today shows mostly stable blood pressures however she does have several blood pressure logs that show systolic blood pressure in the Q000111Q and diastolic in the 123456.  The patient reports that her hallucinations has improved on Nuplazid.  She is no longer having violent or fearful hallucinations.  Her husband states that typically in the evening she will experience hallucinations such as seeing a child at the dinner table or a cat run across the floor.  Husband states that he normally gives her Nuplazid around 2 PM.  Patient returns today for an evaluation.  HISTORY 06/06/2019 (all dictated new, as well as above notes, some dictation done in note pad or Word, outside of chart, may appear as copied):  She reports very little on her own, reports that she stopped the Midrin and Florinef because of high blood pressure numbers.  He is worried about her low numbers.  He has had systolic values of 0000000 over diastolic of 123456 and 123456 at home.  He has measured blood pressure numbers as high as 130s over 70s and 80s, sometimes she has headache.  He has noticed that she tends to have near syncopal spells or even actual collapse when she walks even for a short distance, such as from her recliner to less than 10 steps.  She continues to take Sinemet 25-250 mg strength 1 pill in the morning  and then every 2 hours she takes half a pill for about 6 doses.  She has had better control of her constipation with the help of MiraLAX.  She does not always drink enough water.  She tolerates the Nuplazid and feels that it has helped tone down her hallucinations from daily and scary wants to nearly daily but benign such as seeing kittens and little children  REVIEW OF SYSTEMS: Out of a complete 14 system review of symptoms, the patient complains only of the following symptoms, and all other reviewed systems are negative.  See HPI  ALLERGIES: Allergies  Allergen Reactions  . Morphine And Related Anaphylaxis  . Amantadine Other (See Comments)    Worsening of dyskinesia  . Codeine   . Iodine   . Latex   . Phenylpropanolamine Other (See Comments)    "KNOCKS ME OUT"  . Povidone-Iodine Itching and Rash    REDNESS    HOME MEDICATIONS: Outpatient Medications Prior to Visit  Medication Sig Dispense Refill  . Calcium Carb-Cholecalciferol 500-125 MG-UNIT TABS Take by mouth.    . calcium carbonate (TUMS - DOSED IN MG ELEMENTAL CALCIUM) 500 MG chewable tablet Chew 1 tablet by mouth daily.    . cetirizine (ZYRTEC) 10 MG tablet Take 10 mg by mouth daily as needed.     . Multiple Vitamins-Minerals (MULTIVITAMIN PO) Take 1 tablet by mouth daily.    . Pimavanserin Tartrate (NUPLAZID) 10 MG  TABS Take 10 mg by mouth daily.    . carbidopa-levodopa (SINEMET IR) 25-250 MG tablet Take 1 tablet by mouth 3 (three) times daily.    Marland Kitchen alendronate (FOSAMAX) 70 MG tablet TAKE 1 TAB BY MOUTH ONCE A WEEK  6  . carbidopa-levodopa (SINEMET IR) 25-100 MG tablet Take 1 pill every 2 hours starting at 7 AM for a total of 7 doses per day 630 tablet 3  . fish oil-omega-3 fatty acids 1000 MG capsule Take 2 g by mouth every other day.     . fludrocortisone (FLORINEF) 0.1 MG tablet Take 1 tablet (100 mcg total) by mouth daily. (Patient not taking: Reported on 06/06/2019) 90 tablet 3  . midodrine (PROAMATINE) 5 MG tablet Take  1 tablet (5 mg total) by mouth 2 (two) times daily with a meal. (Patient not taking: Reported on 06/06/2019) 180 tablet 3  . omeprazole (PRILOSEC) 20 MG capsule Take 20 mg by mouth daily as needed.     . simvastatin (ZOCOR) 20 MG tablet Take 20 mg by mouth daily.     No facility-administered medications prior to visit.     PAST MEDICAL HISTORY: Past Medical History:  Diagnosis Date  . Cancer ([place])   . COPD (chronic obstructive pulmonary disease) ([place])   . Dyskinesia   . Headache(784.0)   . Knee fracture    left  . Orthostatic hypotension   . Parkinson's disease ([place])   . Reflux     PAST SURGICAL HISTORY: Past Surgical History:  Procedure Laterality Date  . PARTIAL HIP ARTHROPLASTY Left     FAMILY HISTORY: Family History  Problem Relation Age of Onset  . Pneumonia Mother   . Heart disease Father   . Cancer Brother   . Cancer Paternal Uncle     SOCIAL HISTORY: Social History   Socioeconomic History  . Marital status: Married    Spouse name: Jeneen Rinks  . Number of children: 3  . Years of education: MA  . Highest education level: Not on file  Occupational History    Employer: RETIRED  Social Needs  . Financial resource strain: Not on file  . Food insecurity    Worry: Not on file    Inability: Not on file  . Transportation needs    Medical: Not on file    Non-medical: Not on file  Tobacco Use  . Smoking status: Never Smoker  . Smokeless tobacco: Never Used  Substance and Sexual Activity  . Alcohol use: No    Alcohol/week: 0.0 standard drinks  . Drug use: No  . Sexual activity: Not on file  Lifestyle  . Physical activity    Days per week: Not on file    Minutes per session: Not on file  . Stress: Not on file  Relationships  . Social Herbalist on phone: Not on file    Gets together: Not on file    Attends religious service: Not on file    Active member of club or organization: Not on file    Attends meetings of clubs or organizations: Not on  file    Relationship status: Not on file  . Intimate partner violence    Fear of current or ex partner: Not on file    Emotionally abused: Not on file    Physically abused: Not on file    Forced sexual activity: Not on file  Other Topics Concern  . Not on file  Social History Narrative   Patient lives  at home with family.   Caffeine Use: none      PHYSICAL EXAM  Vitals:   09/19/19 1244 09/19/19 1305  BP: (!) 85/56 100/62  Pulse: 68   Temp: (!) 95.6 F (35.3 C)   Height: 5\' 6"  (1.676 m)    Body mass index is 20.66 kg/m.  Generalized: Well developed, in no acute distress   Neurological examination  Mentation: Alert oriented to time, place, history taking. Follows all commands speech and language fluent Cranial nerve II-XII: Pupils were equal round reactive to light.  Patient has difficulty tracking.  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 facial sensation and strength were normal. Uvula tongue midline. Head turning and shoulder shrug  were normal and symmetric. Motor: The motor testing reveals 4 over 5 strength of all 4 extremities.  She is in a wheelchair today.  More dyskinesias in the lower extremities.  Sensory: Sensory testing is intact to soft touch on all 4 extremities. No evidence of extinction is noted.  Coordination: Cerebellar testing reveals some difficulty with finger-nose-finger bilaterally Gait and station: Patient is in a wheelchair.  Unable to stand for me today   DIAGNOSTIC DATA (LABS, IMAGING, TESTING) - I reviewed patient records, labs, notes, testing and imaging myself where available.     ASSESSMENT AND PLAN 73 y.o. year old female  has a past medical history of Cancer ([place]), COPD (chronic obstructive pulmonary disease) ([place]), Dyskinesia, Headache(784.0), Knee fracture, Orthostatic hypotension, Parkinson's disease ([place]), and Reflux. here with:  Parkinson's disease  -Decrease Sinemet 25-200 mg dose to half a tablet  every 2 hours for a total of 7 doses.  We will slowly continue trying to decrease her dose.  Hypotension  -Typically occurs with sitting better when reclined.  Decreasing dose of Sinemet to see if this offers benefit. -Encouraged to keep a blood pressure log  Hallucinations  -Continue Nuplazid take dose around 11 AM  The patient and her husband is advised that if her symptoms worsen or she develops new symptoms they should let us know.  She will follow-up in 3 months or sooner if needed     Ward Givens, MSN, NP-C 09/19/2019, 2:22 PM [facility] [address] [place], [place] [address] [phone]  I reviewed the above note and documentation by the Nurse Practitioner and agree with the history, exam, assessment and plan as outlined above. I was available for consultation. Star Age, MD, PhD [facility])

## 2019-09-27 ENCOUNTER — Other Ambulatory Visit: Payer: Self-pay | Admitting: Neurology

## 2019-09-27 DIAGNOSIS — G20 Parkinson's disease: Secondary | ICD-10-CM

## 2019-09-30 NOTE — Telephone Encounter (Signed)
I called pt, spoke to pt's husband, Jeneen Rinks. He reports that pt is not taking midodrine currently and they do not need a refill. I will refuse this refill request.

## 2019-10-28 ENCOUNTER — Telehealth: Payer: Self-pay | Admitting: Adult Health

## 2019-10-28 NOTE — Telephone Encounter (Signed)
LMVM for husband to call me back.  I did reach out to [employer].  States that PAP last expired 09-12-19.  She resent the form for provider to [place] (completed) will need signature, but they will reach out to them as well for pts part and financials as well.

## 2019-10-28 NOTE — Telephone Encounter (Signed)
I called Jenny West and spoke to [place].  She will send me a PAP for nuplazid via fax. Last pap expired 09-12-19.

## 2019-10-28 NOTE — Telephone Encounter (Signed)
Pt's husband Jeneen Rinks on [place] called stating that they would like the nurse to fill out the paper work that was filled out last time to be able to get help with the pt's Pimavanserin Tartrate (NUPLAZID) 10 MG TABS Husband states the form information should be in the chart and the pt is needing a refill on her Pimavanserin Tartrate (NUPLAZID) 10 MG TABS Please advise.

## 2019-10-31 NOTE — Telephone Encounter (Signed)
Had not been signed yet.  I called spoke to [employer] re: to bridge program form.  Completed and signed by Dr. Rexene Alberts.  Fax confirmation [phone]. (both bridge program and pt assistance form).

## 2019-11-01 NOTE — Telephone Encounter (Signed)
I called Jenny West and they did receive forms.  Nothing else needed at this time.

## 2020-01-09 ENCOUNTER — Ambulatory Visit: Payer: Medicare Other | Admitting: Adult Health

## 2020-01-29 ENCOUNTER — Other Ambulatory Visit: Payer: Self-pay | Admitting: Neurology

## 2020-01-29 DIAGNOSIS — G20 Parkinson's disease: Secondary | ICD-10-CM

## 2020-03-02 ENCOUNTER — Telehealth: Payer: Self-pay | Admitting: Adult Health

## 2020-03-02 NOTE — Telephone Encounter (Signed)
Rep with [employer] called to verify pt status of  Pimavanserin Tartrate (NUPLAZID) 10 MG TABS

## 2020-03-02 NOTE — Telephone Encounter (Signed)
I called and spoke to Clair Gulling with [employer] and stated that last heard from pt was 10/2019.  When I spoke to him he as not heard back from husband and wanted to know status.  I called pts husband he had been in touch and was told that he needed to apply to medicaid.  I gave him the # [phone] PAP to see what he needs to do .  He appreciated call.  Pt is taking medication.

## 2020-03-05 ENCOUNTER — Encounter: Payer: Self-pay | Admitting: Adult Health

## 2020-03-05 ENCOUNTER — Other Ambulatory Visit: Payer: Self-pay

## 2020-03-05 ENCOUNTER — Ambulatory Visit (INDEPENDENT_AMBULATORY_CARE_PROVIDER_SITE_OTHER): Payer: Medicare Other | Admitting: Adult Health

## 2020-03-05 VITALS — BP 90/56 | HR 71 | Temp 97.3°F | Ht 66.0 in

## 2020-03-05 DIAGNOSIS — R443 Hallucinations, unspecified: Secondary | ICD-10-CM | POA: Diagnosis not present

## 2020-03-05 DIAGNOSIS — G20 Parkinson's disease: Secondary | ICD-10-CM

## 2020-03-05 NOTE — Progress Notes (Addendum)
PATIENT: Jenny West DOB: [birth year]  REASON FOR VISIT: follow up HISTORY FROM: patient  HISTORY OF PRESENT ILLNESS: Today 03/05/20: Ms. Kantner is a 74 year old female with a history of parkinson's disease. She returns today for follow-up. She is currently taking Sinemet 25-250 mg 1/2 tablet 7 times a day. Husband feels that he feels that she is less alert in the AM. Requires assistance with ADLS. Uses wheelchair. Was on Nuplazid but was not able to get it but is currently in the process of getting patient assistance. Hallucinations have not been severe. She returns today for follow-up.   HISTORY 09/19/19:  Ms. Mckean is a 74 year old female with a history of Parkinson's disease.  She returns today for follow-up.  At the last visit Sinemet was reduced to address blood pressure concerns.  She was switched to the 25-100 mg strength however the husband states that he noticed a change in her movements and therefore he switched her back to 25-250 mg strength.  He states that he gives her 1 tablet around 8:00 and then a half a tablet every 2 hours with the last dose at 8 PM.  The blood pressure log today shows mostly stable blood pressures however she does have several blood pressure logs that show systolic blood pressure in the Q000111Q and diastolic in the 123456.  The patient reports that her hallucinations has improved on Nuplazid.  She is no longer having violent or fearful hallucinations.  Her husband states that typically in the evening she will experience hallucinations such as seeing a child at the dinner table or a cat run across the floor.  Husband states that he normally gives her Nuplazid around 2 PM.  Patient returns today for an evaluation.   REVIEW OF SYSTEMS: Out of a complete 14 system review of symptoms, the patient complains only of the following symptoms, and all other reviewed systems are negative.  See HPI  ALLERGIES: Allergies  Allergen Reactions  . Morphine And  Related Anaphylaxis  . Amantadine Other (See Comments)    Worsening of dyskinesia  . Codeine   . Iodine   . Latex   . Phenylpropanolamine Other (See Comments)    "KNOCKS ME OUT"  . Povidone-Iodine Itching and Rash    REDNESS    HOME MEDICATIONS: Outpatient Medications Prior to Visit  Medication Sig Dispense Refill  . Calcium Carb-Cholecalciferol 500-125 MG-UNIT TABS Take by mouth as needed.     . calcium carbonate (TUMS - DOSED IN MG ELEMENTAL CALCIUM) 500 MG chewable tablet Chew 1 tablet by mouth daily.    . carbidopa-levodopa (SINEMET IR) 25-250 MG tablet Take 1/2 tablet at 8:00, 10:00, 12:00, 2:00, 4:00, 6:00 and 8:00 315 tablet 3  . cetirizine (ZYRTEC) 10 MG tablet Take 10 mg by mouth daily as needed.     . Multiple Vitamins-Minerals (MULTIVITAMIN PO) Take 1 tablet by mouth daily.    Marland Kitchen Pimavanserin Tartrate (NUPLAZID) 10 MG TABS Take 10 mg by mouth daily.     No facility-administered medications prior to visit.    PAST MEDICAL HISTORY: Past Medical History:  Diagnosis Date  . Cancer ([place])   . COPD (chronic obstructive pulmonary disease) ([place])   . Dyskinesia   . Headache(784.0)   . Knee fracture    left  . Orthostatic hypotension   . Parkinson's disease ([place])   . Reflux     PAST SURGICAL HISTORY: Past Surgical History:  Procedure Laterality Date  . PARTIAL HIP ARTHROPLASTY Left  FAMILY HISTORY: Family History  Problem Relation Age of Onset  . Pneumonia Mother   . Heart disease Father   . Cancer Brother   . Cancer Paternal Uncle     SOCIAL HISTORY: Social History   Socioeconomic History  . Marital status: Married    Spouse name: Jeneen Rinks  . Number of children: 3  . Years of education: MA  . Highest education level: Not on file  Occupational History    Employer: RETIRED  Tobacco Use  . Smoking status: Never Smoker  . Smokeless tobacco: Never Used  Substance and Sexual Activity  . Alcohol use: No    Alcohol/week: 0.0 standard drinks  . Drug use:  No  . Sexual activity: Not on file  Other Topics Concern  . Not on file  Social History Narrative   Patient lives at home with family.   Caffeine Use: none   Social Determinants of Health   Financial Resource Strain:   . Difficulty of Paying Living Expenses:   Food Insecurity:   . Worried About Charity fundraiser in the Last Year:   . Arboriculturist in the Last Year:   Transportation Needs:   . Film/video editor (Medical):   Marland Kitchen Lack of Transportation (Non-Medical):   Physical Activity:   . Days of Exercise per Week:   . Minutes of Exercise per Session:   Stress:   . Feeling of Stress :   Social Connections:   . Frequency of Communication with Friends and Family:   . Frequency of Social Gatherings with Friends and Family:   . Attends Religious Services:   . Active Member of Clubs or Organizations:   . Attends Archivist Meetings:   Marland Kitchen Marital Status:   Intimate Partner Violence:   . Fear of Current or Ex-Partner:   . Emotionally Abused:   Marland Kitchen Physically Abused:   . Sexually Abused:       PHYSICAL EXAM  Vitals:   03/05/20 1046  BP: (!) 90/56  Pulse: 71  Temp: (!) 97.3 F (36.3 C)  Height: 5\' 6"  (1.676 m)   Body mass index is 20.66 kg/m.  Generalized: Well developed, in no acute distress   Neurological examination  Mentation: Alert oriented to time, place, history taking. Follows all commands speech and language fluent Cranial nerve II-XII: Pupils were equal round reactive to light.  Patient has difficulty tracking.  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 facial sensation and strength were normal. Uvula tongue midline. Head turning and shoulder shrug  were normal and symmetric. Motor: The motor testing reveals 4 over 5 strength of all 4 extremities.  She is in a wheelchair today.  More dyskinesias in the lower extremities.  Sensory: Sensory testing is intact to soft touch on all 4 extremities. No evidence of  extinction is noted.  Coordination: Cerebellar testing reveals some difficulty with finger-nose-finger bilaterally Gait and station: Patient is in a wheelchair.  Unable to stand for me today  DIAGNOSTIC DATA (LABS, IMAGING, TESTING) - I reviewed patient records, labs, notes, testing and imaging myself where available.   ASSESSMENT AND PLAN 74 y.o. year old female  has a past medical history of Cancer ([place]), COPD (chronic obstructive pulmonary disease) ([place]), Dyskinesia, Headache(784.0), Knee fracture, Orthostatic hypotension, Parkinson's disease ([place]), and Reflux. here with:  1: Parkinson's  - Continue Sinemet- Can try combining 8 and 10:00 dose: to 1 tablet at 8:00 and next dose at 12:00 then continue regular dosing -  Restart Nuplazid  - Advised if symptoms worsen or if she develops new symptoms then let us know -FU in 6 months or sooner   I spent 25 minutes of face-to-face and non-face-to-face time with patient.  This included previsit chart review, lab review, study review, order entry, electronic health record documentation, patient education.  Ward Givens, MSN, NP-C 03/05/2020, 11:05 AM [facility] [address] [place], [place] [address] [phone]  I reviewed the above note and documentation by the Nurse Practitioner and agree with the history, exam, assessment and plan as outlined above. I was available for consultation. Star Age, MD, PhD [facility])

## 2020-03-05 NOTE — Patient Instructions (Addendum)
Your Plan:  Continue Sinemet- Can try combining 8 and 10:00 dose: to 1 tablet at 8:00 and next dose at 12:00 Restart Nuplazid If your symptoms worsen or you develop new symptoms please let us know.    Thank you for coming to see [place] at [facility]. I hope we have been able to provide you high quality care today.  You may receive a patient satisfaction survey over the next few weeks. We would appreciate your feedback and comments so that we may continue to improve ourselves and the health of our patients.

## 2020-03-12 ENCOUNTER — Telehealth: Payer: Self-pay | Admitting: Adult Health

## 2020-03-12 MED ORDER — NUPLAZID 10 MG PO TABS: 10.0000 mg | ORAL_TABLET | Freq: Every day | ORAL | 11 refills | Status: AC

## 2020-03-12 NOTE — Addendum Note (Signed)
Addended by: Brandon Melnick on: 03/12/2020 03:32 PM   Modules accepted: Orders

## 2020-03-12 NOTE — Telephone Encounter (Signed)
Printed prescription for faxing to [facility].

## 2020-03-12 NOTE — Telephone Encounter (Signed)
[facility] has called asking for a new script for pt's Pimavanserin Tartrate (NUPLAZID) 10 MG TABS She said that it can be faxed to them @ [phone], if there are questions they can be called at [phone]

## 2020-03-12 NOTE — Telephone Encounter (Signed)
Fax confirmation received.  [phone].

## 2020-03-16 NOTE — Telephone Encounter (Signed)
Received [employer] for PAP Nuplazid. For 90 days while applies for medicaid. [phone].

## 2020-04-27 ENCOUNTER — Other Ambulatory Visit: Payer: Self-pay | Admitting: Adult Health

## 2020-09-17 ENCOUNTER — Ambulatory Visit (INDEPENDENT_AMBULATORY_CARE_PROVIDER_SITE_OTHER): Payer: Medicare Other | Admitting: Adult Health

## 2020-09-17 ENCOUNTER — Encounter: Payer: Self-pay | Admitting: Adult Health

## 2020-09-17 VITALS — BP 106/70 | HR 74

## 2020-09-17 DIAGNOSIS — G20 Parkinson's disease: Secondary | ICD-10-CM | POA: Diagnosis not present

## 2020-09-17 DIAGNOSIS — R443 Hallucinations, unspecified: Secondary | ICD-10-CM | POA: Diagnosis not present

## 2020-09-17 MED ORDER — CARBIDOPA-LEVODOPA 25-250 MG PO TABS: ORAL_TABLET | ORAL | 3 refills | Status: AC

## 2020-09-17 NOTE — Progress Notes (Addendum)
PATIENT: Jenny West DOB: 1946-06-02  REASON FOR VISIT: follow up HISTORY FROM: patient  HISTORY OF PRESENT ILLNESS: Today 09/17/20:  Jenny West is a 74 year old female with a history of Parkinson's disease.  She returns today for follow-up.  She is currently taking Sinemet 25-250 mg half a tablet seven times a day or more if they felt that she needed it for breakthrough symptoms. She requires assistance with all ADLs.  Uses a wheelchair.  Requires assistance with feeding.  She does have a history of hallucinations.  At the last visit Nuplazid was restarted-patient reports that he noticed a decrease in hallucinations but now with an insurance change they have not been able to receive the medication. They are in the process of trying to obtain this. Patient does notice some changes with her memory and issues with word finding. Has reports that since the last visit she had a drop in blood pressure and had to go to the emergency room. She was restarted on midodrine. Husband reports that primary care order physical therapy for the patient. She has approximately 2 weeks left.  HISTORY 03/05/20: Jenny West is a 74 year old female with a history of parkinson's disease. She returns today for follow-up. She is currently taking Sinemet 25-250 mg 1/2 tablet 7 times a day. Husband feels that he feels that she is less alert in the AM. Requires assistance with ADLS. Uses wheelchair. Was on Nuplazid but was not able to get it but is currently in the process of getting patient assistance. Hallucinations have not been severe. She returns today for follow-up.   REVIEW OF SYSTEMS: Out of a complete 14 system review of symptoms, the patient complains only of the following symptoms, and all other reviewed systems are negative.  See HPI  ALLERGIES: Allergies  Allergen Reactions  . Morphine And Related Anaphylaxis  . Amantadine Other (See Comments)    Worsening of dyskinesia  . Codeine   .  Iodine   . Latex   . Phenylpropanolamine Other (See Comments)    "KNOCKS ME OUT"  . Povidone-Iodine Itching and Rash    REDNESS    HOME MEDICATIONS: Outpatient Medications Prior to Visit  Medication Sig Dispense Refill  . Calcium Carb-Cholecalciferol 500-125 MG-UNIT TABS Take by mouth as needed.     . calcium carbonate (TUMS - DOSED IN MG ELEMENTAL CALCIUM) 500 MG chewable tablet Chew 1 tablet by mouth daily.    . carbidopa-levodopa (SINEMET IR) 25-250 MG tablet Take 1/2 tablet at 8:00, 10:00, 12:00, 2:00, 4:00, 6:00 and 8:00 315 tablet 3  . cetirizine (ZYRTEC) 10 MG tablet Take 10 mg by mouth daily as needed.     . Multiple Vitamins-Minerals (MULTIVITAMIN PO) Take 1 tablet by mouth daily.    Marland Kitchen Pimavanserin Tartrate (NUPLAZID) 10 MG TABS Take 10 mg by mouth daily. 30 tablet 11   No facility-administered medications prior to visit.    PAST MEDICAL HISTORY: Past Medical History:  Diagnosis Date  . Cancer ([place])   . COPD (chronic obstructive pulmonary disease) ([place])   . Dyskinesia   . Headache(784.0)   . Knee fracture    left  . Orthostatic hypotension   . Parkinson's disease ([place])   . Reflux     PAST SURGICAL HISTORY: Past Surgical History:  Procedure Laterality Date  . PARTIAL HIP ARTHROPLASTY Left     FAMILY HISTORY: Family History  Problem Relation Age of Onset  . Pneumonia Mother   . Heart disease Father   .  Cancer Brother   . Cancer Paternal Uncle     SOCIAL HISTORY: Social History   Socioeconomic History  . Marital status: Married    Spouse name: Jeneen Rinks  . Number of children: 3  . Years of education: MA  . Highest education level: Not on file  Occupational History    Employer: RETIRED  Tobacco Use  . Smoking status: Never Smoker  . Smokeless tobacco: Never Used  Substance and Sexual Activity  . Alcohol use: No    Alcohol/week: 0.0 standard drinks  . Drug use: No  . Sexual activity: Not on file  Other Topics Concern  . Not on file  Social  History Narrative   Patient lives at home with family.   Caffeine Use: none   Social Determinants of Health   Financial Resource Strain:   . Difficulty of Paying Living Expenses: Not on file  Food Insecurity:   . Worried About Charity fundraiser in the Last Year: Not on file  . Ran Out of Food in the Last Year: Not on file  Transportation Needs:   . Lack of Transportation (Medical): Not on file  . Lack of Transportation (Non-Medical): Not on file  Physical Activity:   . Days of Exercise per Week: Not on file  . Minutes of Exercise per Session: Not on file  Stress:   . Feeling of Stress : Not on file  Social Connections:   . Frequency of Communication with Friends and Family: Not on file  . Frequency of Social Gatherings with Friends and Family: Not on file  . Attends Religious Services: Not on file  . Active Member of Clubs or Organizations: Not on file  . Attends Archivist Meetings: Not on file  . Marital Status: Not on file  Intimate Partner Violence:   . Fear of Current or Ex-Partner: Not on file  . Emotionally Abused: Not on file  . Physically Abused: Not on file  . Sexually Abused: Not on file      PHYSICAL EXAM  Vitals:   09/17/20 1310  BP: 106/70  Pulse: 74   There is no height or weight on file to calculate BMI.  Generalized: Well developed, in no acute distress   Neurological examination  Mentation: Alert. Follows all commands speech is limited. Difficulty with completing sentences-word finding. Masked face Cranial nerve II-XII: Pupils were equal round reactive to light. Extraocular movements were full, visual field were full on confrontational test. Facial sensation and strength were normal. Uvula tongue midline. Head is tilted to the left Motor: The motor testing reveals 5 over 5 strength in the upper extremities. 3 out of 5 in the lower extremities.  Sensory: Sensory testing is intact to soft touch on all 4 extremities. No evidence of  extinction is noted.  Coordination: Cerebellar testing reveals difficulty with finger-nose-finger due to vision. Unable to complete heel-to-shin Gait and station: Patient is wheelchair-bound   DIAGNOSTIC DATA (LABS, IMAGING, TESTING) - I reviewed patient records, labs, notes, testing and imaging myself where available.     ASSESSMENT AND PLAN 74 y.o. year old female  has a past medical history of Cancer ([place]), COPD (chronic obstructive pulmonary disease) ([place]), Dyskinesia, Headache(784.0), Knee fracture, Orthostatic hypotension, Parkinson's disease ([place]), and Reflux. here with :  1.  Parkinson's disease 2. Hallucinations  -Clarified Sinemet dose 25-50 mg1 tablet at 8:00am, 1/2 tablet at 12, 1/2 at 3, 1/2 tablet 6 and 1/2 at 39. Cautioned the patient and her husband about taking  too much of this medication. Reviewed side effects. -Patient will restart Nuplazid when she is able to get insurance approval -Advised if symptoms worsen or she develops new symptoms they should let us know Follow-up in 6 months or sooner if needed    I spent 30 minutes of face-to-face and non-face-to-face time with patient.  This included previsit chart review, lab review, study review, order entry, electronic health record documentation, patient education.  Ward Givens, MSN, NP-C 09/17/2020, 1:06 PM [facility] [address], [place], [place] [address] [phone]  I reviewed the above note and documentation by the Nurse Practitioner and agree with the history, exam, assessment and plan as outlined above. I was available for consultation. Star Age, MD, PhD [facility])

## 2020-09-17 NOTE — Patient Instructions (Signed)
Your Plan:  Continue Sinemet 25-250 mg: 1 tablet at 8:00am, 1/2 tablet at 12, 1/2 at 3, 1/2 tablet 6 and 1/2 at 9 If your symptoms worsen or you develop new symptoms please let us know.       Thank you for coming to see [place] at [facility]. I hope we have been able to provide you high quality care today.  You may receive a patient satisfaction survey over the next few weeks. We would appreciate your feedback and comments so that we may continue to improve ourselves and the health of our patients.

## 2021-03-18 ENCOUNTER — Ambulatory Visit: Payer: Medicare Other | Admitting: Neurology

## 2021-04-27 ENCOUNTER — Telehealth: Payer: Self-pay | Admitting: Neurology

## 2021-04-27 NOTE — Telephone Encounter (Signed)
Can you ask her to come for the 12:30 slot. I don't think I can see her wo a break in AM clinic. But would be able to see her at 12:30 that day.

## 2021-04-27 NOTE — Telephone Encounter (Signed)
Pt is on reschedule list for MD leaving early on 6/23. Is it possible for her to be seen at 11:30 that day? Pt has already been rescheduled once. Please advise.

## 2021-04-28 NOTE — Telephone Encounter (Signed)
Pt agreed to change time from 1:00 to 12:30 pm.

## 2021-05-13 ENCOUNTER — Ambulatory Visit (INDEPENDENT_AMBULATORY_CARE_PROVIDER_SITE_OTHER): Payer: Medicare Other | Admitting: Neurology

## 2021-05-13 ENCOUNTER — Encounter: Payer: Self-pay | Admitting: Neurology

## 2021-05-13 VITALS — BP 118/72 | HR 66

## 2021-05-13 DIAGNOSIS — R443 Hallucinations, unspecified: Secondary | ICD-10-CM

## 2021-05-13 DIAGNOSIS — R413 Other amnesia: Secondary | ICD-10-CM | POA: Diagnosis not present

## 2021-05-13 DIAGNOSIS — G249 Dystonia, unspecified: Secondary | ICD-10-CM | POA: Diagnosis not present

## 2021-05-13 DIAGNOSIS — I951 Orthostatic hypotension: Secondary | ICD-10-CM

## 2021-05-13 DIAGNOSIS — R4702 Dysphasia: Secondary | ICD-10-CM

## 2021-05-13 DIAGNOSIS — R471 Dysarthria and anarthria: Secondary | ICD-10-CM

## 2021-05-13 DIAGNOSIS — K5909 Other constipation: Secondary | ICD-10-CM

## 2021-05-13 DIAGNOSIS — G20 Parkinson's disease: Secondary | ICD-10-CM

## 2021-05-13 NOTE — Patient Instructions (Signed)
It was nice to see you again today.  As discussed, we will maintain you on the Sinemet at the current dose and timings.

## 2021-05-13 NOTE — Progress Notes (Signed)
Subjective:    Patient ID: Jenny West is a 75 y.o. female.  HPI    Interim history:   Ms. Vowell is a very pleasant 75 year old right-handed woman with an underlying medical history of recurrent falls with fracture, lower back pain with lumbar radiculopathy, COPD, diplopia, and cancer, who presents for followup consultation of her advanced left-sided predominant Parkinson's disease, complicated by orthostatic hypotension, hallucinations, dyskinesias, sleep disturbance, recurrent falls, cervical dystonia and mild memory loss. She is accompanied by her husband again today. I last saw her on 06/06/19, at which time I suggested we switch her to Sinemet 25-100 mg strength from 25-250 mg strength.  She was advised to take 1 pill 7 times a day, on a 2 hourly schedule.  She had low blood pressure values.  She was able to tolerate the Nuplazid and hallucinations were improved.  She saw Ward Givens, NP in the interim on 09/19/2019, at which time she was back on Sinemet 25-250 mg strength.  She was still on Nuplazid.  She was advised to take Sinemet half a pill 7 times a day.  She saw Ward Givens, NP in follow-up on 03/05/2020, at which time she was no longer on Nuplazid.  They were trying to get her back on it.  She saw Ward Givens, NP in follow-up on 09/17/2020, at which time she was still not on Nuplazid but they were trying to get her back on it through her insurance.  Her primary care had ordered home health PT.  Today, 05/13/2021: She reports very little, she is unable to provide her history, she is minimally verbal.  He reports that her hallucinations have been stable.  She has not been on Nuplazid for about a year now.  He is still trying to get approved through insurance and is working with the [employer] assistance program.  She has had further decline in her speech, and cognitively, she is often unable to answer questions or there is a long delay before she answers a question.  He  reports that by the time he comes home in the evening, she is often practically nonverbal.  He has a caretaker, Nevin Bloodgood, who comes at 8 AM and stays till 4 or 4:30 PM and he often gets home by 5:30 PM or 6 at the latest.  She had physical therapy at home and he would like to see if we can reinitiate this through [facility] out of [place], [place].  She has had no recent falls but she is practically unable to walk.  She has had difficulty swallowing especially with thin liquids so they have been giving the medication with yogurt.  She is on the 25-250 mg strength of Sinemet generic 1 pill at 8 AM and half a pill at 12, 3 PM, 6 PM and 9 PM for a total of 3 pills a day.  She has had some dyskinesias.  She has allergy symptoms and Claritin seems to work better than Zyrtec.  Constipation is an ongoing issue.  Trying to get her to drink enough water is an ongoing issue as well.  She has not been taking MiraLAX.  He will consider this.    The patient's allergies, current medications, family history, past medical history, past social history, past surgical history and problem list were reviewed and updated as appropriate.    Previously (copied from previous notes for reference):    I saw her on 01/31/2019, at which time she was tolerating the Nuplazid, but per husband, she  may have had more confusion since starting the new medication but otherwise was tolerating it.  She felt that it was helping the hallucinations.  She was having constipation, was taking MiraLAX as needed, not daily, she would go up to 3 days without having a bowel movement.  I suggested we keep her medications the same but we talked about the importance of being really proactive about constipation issues.         I saw her on 08/02/2018, at which time she reported that she would like to try Nuplazid, for her hallucinations. I suggested we try her on a lower dose. She had interim hospitalization in [place] him a [place]  for small bowel obstruction. She required surgery for this, she had inpatient rehabilitation afterwards. She was still struggling with constipation.   I saw her on 06/21/2017, at which time she is still struggling with vivid hallucinations. She was agreeable to trying Nuplazid again at a lower dose, we initiated the authorization process for this medication.   The interim seen by Vaughan Browner, Nurse practitioner on 01/25/2018, at which time she reported not being on the Nuplazid and she wanted to hold off trying it.   I saw her on 02/16/2017, at which time she reported having daily hallucinations, sometimes at night, she felt scared at times. She was more sleepy and lethargic during the day. She had help from 2 PM to 6 PM, until her husband comes home from work. She had no recent falls. She was not drinking enough water and I stressed the importance of water intake. I suggested we try or consider trying Nuplazid in the future. I suggested she continue with her Parkinson's regimen otherwise. She was taking Sinemet 1 pill for her first dose, then half a pill every 2 hours, last dose around 8 PM. She was slower in her swallowing capability but no choking spells were reported that her or her husband.   I saw her on 10/17/2016, at which time she reported minimal pain at night. She had visual problems and had prism eye glasses. She had more hallucinations, auditory and visual hallucinations. Thankfully, she had no recent falls. She had help at the house from 8 AM to 2 PM Monday through Friday, usually by herself about 4 hours before husband comes home after work. She had intermittent left arm pain. Her memory scores were fairly stable: MMSE: 27/30, CDT: 2/4, AFT: 10/min. I suggested we continue with her current dose of Sinemet.    I saw her on 04/28/2016, at which time she reported feeling fairly stable. She had no recent falls. She had seen Dr. Linus Mako in March 2017. She was continued on levodopa therapy and  Dr. Linus Mako at talked to them about potentially involving palliative care.   I saw her on 12/24/2015, at which time she reported that she switched back to regular Sinemet and stop the Rytary, she felt better with this. She had an appointment with Dr. Linus Mako at [facility] pending for 01/26/2016. She had more issues with constipation. She was switched back to Sinemet in November 2016. Hallucinations were stable. She was not drinking enough water. I suggested that she continue with her medication regimen. She was encouraged to drink more water and be more proactive with her constipation issues but to avoid Metamucil.   Of note, the patient no showed for an appointment on 12/03/2015.   I saw her on 08/20/2015, at which time she reported more neck tilt, more wearing off, more hallucinations, less good  sleep. She has an aide from 8 AM to 2 PM, Mon-Fri, Paula. She did not always get her 4 doses of Rytary, per husband. He still works full-time (as Dietitian). They have 3 sons, 2 in [place] and 1 in [place]. She would stay in the lift chair most of the day. She was having vivid hallucinations and was not sleeping well at night. She would see disturbing and scary images at night. During the day she sees images of mice or bugs. She had occasional constipation. She had an appointment with Dr. Linus Mako pending for 01/26/2016. She had no recent falls. Her posture was worse. For her Parkinson's related hallucinations I suggested a trial of Nuplazid. I suggested she continue with ProAmatine 5 mg twice daily, Florinef 0.1 mg once daily, and Rytary 195 mg 2 pills 4 times a day. In the interim, the patient called on 09/15/2015 and reported that the Rytary was not helpful and she wanted to stop the medication. She wanted to go back to Sinemet. I suggested she move up her appointment with Dr. Linus Mako and explained to them that medication changes are difficult and not without setback at this point in her disease process.   I saw her  on 04/16/2015, at which time she reported having allergy symptoms and excess tearing. She had no recent falls. She still had some hallucinations. She was sleeping in her lift chair. She was not drinking enough water. She had fallen out of her lift chair and had a bruise on her left shoulder. She was having constipation. I suggested she continue with Rytary 195 mg, 2 pills 4 times a day. This was working reasonably well for her. She also had a follow-up appointment with Dr. Linus Mako.   I saw her on 12/15/2014, at which time her husband reported that they had cut down on the new medication, Rytary, as her insurance did not approve the new prescription. She has was taking 195 mg 3 - 2 - 2, as she was having more dyskinesias and more significant off time. She had not fallen recently but was more and more in the wheelchair. They have help daily from 8 AM to 2 PM. She was by herself usually from 2 PM to 6 PM. He works full-time, and [employer] but works very close to home. I suggested we try Rytary 3 pills 4 times a day.   I saw her on 09/29/2014, at which time she reported feeling a little worse. She had been changed to Rytary, from a total dose of levodopa at 1750 mg to a total dose of Rytary of 1755 mg daily. We talked about her visit with Dr. Linus Mako. She was not deemed a surgical candidate for DBS placement. I increased her Rytary to 3 pills 4 times a day. She missed an appointment on 12/01/14.   I saw her on 03/27/14, at which time the patient requested a referral for DBS evaluation and I felt that she was not a surgical treatment, but we mutually agreed to proceed for a second opinion and DBS evaluation. I made a referral to [facility], as per patient's request and she saw Dr. Linus Mako. I reviewed his notes, and appreciate his input. In essence, she had an extensive evaluation and was not deemed a surgical candidate. Most recently she was started on Rytary and also the use of Duopa was discussed  with the patient and her husband. Rytary at 145 mg strength 3 pills 3 times a day was not helpful. She was then  placed on 195 mg 3 pills 3 times a day which she continues to take currently. Her total dose of levodopa at the very end was 1750 mg total dose. Ryrary at the current dose is 1755 mg total for the day.   I saw her on 09/26/2013, at which time I asked her to drink more water and do more leg exercises. I did not make changes to her medications and asked her to continue Florinef, Sinemet, ProAmatine and I referred her to physical therapy.   I first met her on 03/21/13, at which time I felt she was clinically stable and did not make any medication changes but did re-iterate to them the importance of taking the medication on time.     She previously followed with Dr. Morene Antu and was last seen by him on 12/20/2012, at which time he felt that she was overall doing fairly well but still had significant postural hypotension. She had improved dyskinesias and no significant change in her MMSE on amantadine. Her falls assessment tool score was 14 at the time. Dr. Erling Cruz talked to her about trying a compression hose and increased her midodrine to 5 mg 3 times a day and continued her on Florinef 0.1 mg daily. He requested a BMP at the time, which was unremarkable. In February after a phone conversation Dr. Erling Cruz increased her amantadine to 50 mg twice daily from once daily due to increase in dyskinesias.    She has been on Amantadine 50 mg twice daily, Florinef 0.1 mg once daily, Zyrtec once daily, Sinemet 25-250 mg strength half a tablet 7 times a day, starting at 7:30 AM, then 9:30 AM, 11:30 AM, 1:30 PM, 3:30 PM, 5 PM and 7 PM. Midodrine 5 mg 2 times a day, omeprazole 20 mg 2 by mouth daily, calcium, multivitamin.   She was diagnosed with Parkinson's disease in 1991 at which time she had difficulty using her left hand. She was placed on Eldepryl and Sinemet. She was first seen by Dr. Erling Cruz in May 1994. She had  side effects from medication at the time and had side effects on Permax before then. In December 1995 Parlodel was added. She was on this for several years but developed shortness of breath in 2004 and was tapered off of that medication. She was diagnosed with COPD and in 2008 developed postural hypotension and was started on midodrine after which Florinef was added. She has had motor fluctuations and freezing, postural hypotension and dyskinesias as well as late afternoon hallucinations. She has fallen repeatedly. She does need help with ADLs. She used a walker until December 2013. She fell in September and December 2013 and fractured her left knee. She does not have any history of impulse control disorder. She has urinary incontinence. She has had double vision treated with prisms. In December 2013 her MMSE was 28, clock drawing was 4, and AFT was 8. In January 2014 her MMSE was 27, clock drawing was 4/4, and animal fluency was 13. CT C-spine in January showed no fracture and mild to moderate degenerative changes.    Her Past Medical History Is Significant For: Past Medical History:  Diagnosis Date   Cancer ([place])    COPD (chronic obstructive pulmonary disease) ([place])    Dyskinesia    Headache(784.0)    Knee fracture    left   Orthostatic hypotension    Parkinson's disease ([place])    Reflux     Her Past Surgical History Is Significant For: Past Surgical  History:  Procedure Laterality Date   PARTIAL HIP ARTHROPLASTY Left     Her Family History Is Significant For: Family History  Problem Relation Age of Onset   Pneumonia Mother    Heart disease Father    Cancer Brother    Cancer Paternal Uncle     Her Social History Is Significant For: Social History   Socioeconomic History   Marital status: Married    Spouse name: Jeneen Rinks   Number of children: 3   Years of education: MA   Highest education level: Not on file  Occupational History    Employer: RETIRED  Tobacco Use   Smoking  status: Never   Smokeless tobacco: Never  Substance and Sexual Activity   Alcohol use: No    Alcohol/week: 0.0 standard drinks   Drug use: No   Sexual activity: Not on file  Other Topics Concern   Not on file  Social History Narrative   Patient lives at home with family.   Caffeine Use: none   Social Determinants of Radio broadcast assistant Strain: Not on file  Food Insecurity: Not on file  Transportation Needs: Not on file  Physical Activity: Not on file  Stress: Not on file  Social Connections: Not on file    Her Allergies Are:  Allergies  Allergen Reactions   Morphine And Related Anaphylaxis   Amantadine Other (See Comments)    Worsening of dyskinesia   Codeine    Iodine    Latex    Phenylpropanolamine Other (See Comments)    "KNOCKS ME OUT"   Povidone-Iodine Itching and Rash    REDNESS  :   Her Current Medications Are:  Outpatient Encounter Medications as of 05/13/2021  Medication Sig   Calcium Carb-Cholecalciferol 500-125 MG-UNIT TABS Take by mouth as needed.    calcium carbonate (TUMS - DOSED IN MG ELEMENTAL CALCIUM) 500 MG chewable tablet Chew 1 tablet by mouth daily.   carbidopa-levodopa (SINEMET IR) 25-250 MG tablet 1 tablet at 8:00am, 1/2 tablet at 12, 1/2 at 3, 1/2 tablet 6 and 1/2 at 9   cetirizine (ZYRTEC) 10 MG tablet Take 10 mg by mouth daily as needed.    fludrocortisone (FLORINEF) 0.1 MG tablet Take 1 tablet by mouth daily.   midodrine (PROAMATINE) 5 MG tablet Take 5 mg by mouth in the morning and at bedtime.   Multiple Vitamins-Minerals (MULTIVITAMIN PO) Take 1 tablet by mouth daily.   simvastatin (ZOCOR) 20 MG tablet Take 20 mg by mouth daily.   Pimavanserin Tartrate (NUPLAZID) 10 MG TABS Take 10 mg by mouth daily. (Patient not taking: Reported on 05/13/2021)   [DISCONTINUED] alendronate (FOSAMAX) 70 MG tablet Take 70 mg by mouth once a week. Take with a full glass of water on an empty stomach. (Patient not taking: Reported on 05/13/2021)   No  facility-administered encounter medications on file as of 05/13/2021.  :  Review of Systems:  Out of a complete 14 point review of systems, all are reviewed and negative with the exception of these symptoms as listed below:  Review of Systems  Neurological:        Here for f/u on PD. Pt reports she has declined since last visit. Pt's husband reports in the last 4-6  pt has been noticilbility quite and has a hard time communicate. He sts the pt does not ambulate more than 1 step by herself.  He also reports trouble swallowing and neck stiffness.   Objective:  Neurological Exam  Physical  Exam Physical Examination:   Vitals:   05/13/21 1234  BP: 118/72  Pulse: 66  SpO2: 98%   General Examination: The patient is a very pleasant 75 y.o. female.  She is situated in her wheelchair.  She is minimally verbal.    HEENT: Normocephalic, atraumatic, pupils are equal, round and reactive to light, extraocular tracking is severely impaired, she has corrective eyeglasses in place with prism lenses.  She has severe neck rigidity and significant cervical dystonia with severe left laterocollis. Face shows moderate facial masking. Airway examination is otherwise stable, no significant drooling noted. Speech is moderately hypophonic and moderately dysarthric. No evidence of blepharospasm.   Chest: Clear to auscultation without wheezing, rhonchi or crackles noted.   Heart: S1+S2+0, regular and normal without murmurs, rubs or gallops noted.   Abdomen: Soft, non-tender and non-distended.   Extremities: There is trace pitting edema in the distal lower extremities bilaterally.   Skin: Warm and dry without trophic changes noted.   Musculoskeletal: exam reveals no obvious joint deformities.   Neurologically: Mental status: The patient is awake, pays fairly good attention, is unable to answer most questions, minimally verbal.   Her immediate and remote memory, attention, language skills and fund of  knowledge are impaired. She has severe slowness in responding and word finding difficulties.   On 09/29/2014: MMSE is 26/30, clock drawing is 3/4, animal fluency is 12/min.   On 04/16/2015: MMSE: 28/30, CDT: 2/4, AFT: 5/min.   On 12/24/2015: MMSE: 30/30, CDT: 3/4, AFT: 7/min.   On 10/17/2016: MMSE: 27/30, CDT: 2/4, AFT: 10/min.    On 06/21/2017: MMSE: 27/30, CDT: 3/4, AFT: 6/min.   On 01/31/2019: MMSE: 28/30, CDT: 1/4, AFT: 5/min.   Cranial nerves II - XII are as described above under HEENT exam.   Motor exam:  thin bulk, significant bradycardia, intermittent resting tremor noted in both hands, global strength of about 4 out of 5, intermittent upper body dyskinesias noted.  Unable to stand or walk today. Reflexes are difficult to elicit. Sensory exam is intact to light touch. Fine motor skills are globally severely impaired, still somewhat worse on the left compared to right.   Assessment and Plan:  In summary, Alyna Stensland is a 75 year old female with an underlying complex medical history of falls with fracture, lower back pain with lumbar radiculopathy, COPD, diplopia, and cancer, who presents for followup consultation of her advanced left-sided predominant Parkinson's disease, complicated by orthostatic hypotension, for which she is on midodrine and and Florinef, hallucinations, dyskinesias, sleep disturbance, recurrent falls, cervical dystonia, memory loss, constipation, s/p bowel obstruction, requiring surgery. She has had progressive decline and overall a complicated course.   She developed hallucinations and dyskinesias and memory loss with time. She has been on multiple different medications for Parkinson's disease over time. She has had more than one evaluation with  Dr. Linus Mako at [facility]. She tried Nuplazid in September 2016 but they stopped it due to side effects. She may not have tried it long enough and we mutually agreed to try it again at a lower dose. She started this in September 2019  and was able to tolerate the medication and her hallucinations improved.  She has been off of it since 2020 due to insurance coverage issues.  They are trying to get assistance from the [employer]. Unfortunately, she is at this point wheelchair-bound and her mobility is very limited, even transfers sometimes are difficult because of this and low blood pressure values. She and her husband are reminded  to be very proactive about constipation issues. He is advised to add MiraLAX as needed. Thankfully, she has help at the house and a caring husband, but the situation has become more and more difficult and challenging with time.  They are advised to continue with the Sinemet 25-250 mg strength at the current dosing and timings.  I will request home health physical therapy and speech therapy.  She is advised to follow-up routinely to see Ward Givens, NP in 6 months, sooner if needed.  I answered all their questions today and the patient and her husband were in agreement. I spent 30 minutes in total face-to-face time and in reviewing records during pre-charting, more than 50% of which was spent in counseling and coordination of care, reviewing test results, reviewing medications and treatment regimen and/or in discussing or reviewing the diagnosis of PD, the prognosis and treatment options. Pertinent laboratory and imaging test results that were available during this visit with the patient were reviewed by me and considered in my medical decision making (see chart for details).

## 2021-09-06 ENCOUNTER — Other Ambulatory Visit: Payer: Self-pay | Admitting: Neurology

## 2021-09-06 MED ORDER — MIDODRINE HCL 5 MG PO TABS: 5.0000 mg | ORAL_TABLET | Freq: Two times a day (BID) | ORAL | 3 refills | Status: DC

## 2021-09-06 NOTE — Telephone Encounter (Signed)
Pt request refill for midodrine (PROAMATINE) 5 MG tablet at [id number]

## 2021-09-06 NOTE — Telephone Encounter (Signed)
I called pts husband.  The pcp had been filling the midodrine 5mg  po bid.  I see that we have done this in the past , last 2020.  Pt has been taking per husband.  Is out and needs refills as has new pcp and cannot get refills from them at this time.  Please address.

## 2021-10-11 ENCOUNTER — Telehealth: Payer: Self-pay | Admitting: Neurology

## 2021-10-11 NOTE — Telephone Encounter (Signed)
Nevin Bloodgood called to inform that pt has passed away.

## 2021-10-21 DEATH — deceased

## 2021-10-22 ENCOUNTER — Other Ambulatory Visit: Payer: Self-pay | Admitting: Adult Health

## 2021-11-18 ENCOUNTER — Ambulatory Visit: Payer: Medicare Other | Admitting: Adult Health

## 2021-12-04 ENCOUNTER — Other Ambulatory Visit: Payer: Self-pay | Admitting: Adult Health
